# Patient Record
Sex: Female | Born: 1945 | Race: White | Hispanic: No | Smoking: Never smoker
Health system: Southern US, Community
[De-identification: ages and names within clinical notes are randomized; demographics above are authoritative.]

## PROBLEM LIST (undated history)

## (undated) DIAGNOSIS — I214 Non-ST elevation (NSTEMI) myocardial infarction: Secondary | ICD-10-CM

## (undated) HISTORY — DX: Non-ST elevation (NSTEMI) myocardial infarction: I21.4

---

## 1996-12-17 HISTORY — PX: THYROIDECTOMY, PARTIAL: SHX18

## 2002-12-17 HISTORY — PX: BREAST ENHANCEMENT SURGERY: SHX7

## 2010-12-18 LAB — HM DEXA SCAN: HM Dexa Scan: NORMAL

## 2012-05-28 DIAGNOSIS — E039 Hypothyroidism, unspecified: Secondary | ICD-10-CM | POA: Diagnosis not present

## 2012-05-28 DIAGNOSIS — M62838 Other muscle spasm: Secondary | ICD-10-CM | POA: Diagnosis not present

## 2012-05-28 DIAGNOSIS — R030 Elevated blood-pressure reading, without diagnosis of hypertension: Secondary | ICD-10-CM | POA: Diagnosis not present

## 2012-06-09 DIAGNOSIS — H2510 Age-related nuclear cataract, unspecified eye: Secondary | ICD-10-CM | POA: Diagnosis not present

## 2012-06-09 DIAGNOSIS — D3130 Benign neoplasm of unspecified choroid: Secondary | ICD-10-CM | POA: Diagnosis not present

## 2012-11-04 DIAGNOSIS — Z23 Encounter for immunization: Secondary | ICD-10-CM | POA: Diagnosis not present

## 2013-07-21 DIAGNOSIS — Z0000 Encounter for general adult medical examination without abnormal findings: Secondary | ICD-10-CM | POA: Diagnosis not present

## 2013-07-21 DIAGNOSIS — N951 Menopausal and female climacteric states: Secondary | ICD-10-CM | POA: Diagnosis not present

## 2013-07-21 DIAGNOSIS — Z79899 Other long term (current) drug therapy: Secondary | ICD-10-CM | POA: Diagnosis not present

## 2013-07-21 DIAGNOSIS — E039 Hypothyroidism, unspecified: Secondary | ICD-10-CM | POA: Diagnosis not present

## 2013-07-21 DIAGNOSIS — I43 Cardiomyopathy in diseases classified elsewhere: Secondary | ICD-10-CM | POA: Diagnosis not present

## 2014-12-28 DIAGNOSIS — E033 Postinfectious hypothyroidism: Secondary | ICD-10-CM | POA: Diagnosis not present

## 2014-12-28 DIAGNOSIS — Z79899 Other long term (current) drug therapy: Secondary | ICD-10-CM | POA: Diagnosis not present

## 2014-12-28 DIAGNOSIS — E785 Hyperlipidemia, unspecified: Secondary | ICD-10-CM | POA: Diagnosis not present

## 2014-12-28 DIAGNOSIS — N951 Menopausal and female climacteric states: Secondary | ICD-10-CM | POA: Diagnosis not present

## 2014-12-28 DIAGNOSIS — Z09 Encounter for follow-up examination after completed treatment for conditions other than malignant neoplasm: Secondary | ICD-10-CM | POA: Diagnosis not present

## 2014-12-28 DIAGNOSIS — I10 Essential (primary) hypertension: Secondary | ICD-10-CM | POA: Diagnosis not present

## 2014-12-28 DIAGNOSIS — Z23 Encounter for immunization: Secondary | ICD-10-CM | POA: Diagnosis not present

## 2014-12-28 DIAGNOSIS — I872 Venous insufficiency (chronic) (peripheral): Secondary | ICD-10-CM | POA: Diagnosis not present

## 2014-12-28 LAB — TSH: TSH: 3.3 u[IU]/mL (ref <=5.90)

## 2014-12-28 LAB — BASIC METABOLIC PANEL
BUN: 17 mg/dL (ref 4–21)
CREATININE: 0.8 mg/dL (ref <=1.1)

## 2014-12-28 LAB — LIPID PANEL
Cholesterol: 219 mg/dL — AB (ref 0–200)
HDL: 72 mg/dL — AB (ref 35–70)
LDL Cholesterol: 107 mg/dL
Triglycerides: 200 mg/dL — AB (ref 40–160)

## 2014-12-28 LAB — CBC AND DIFFERENTIAL: Hemoglobin: 15 g/dL (ref 12.0–16.0)

## 2015-07-13 ENCOUNTER — Other Ambulatory Visit: Payer: Self-pay | Admitting: Internal Medicine

## 2015-07-15 ENCOUNTER — Encounter: Payer: Self-pay | Admitting: Internal Medicine

## 2015-07-15 DIAGNOSIS — Z8619 Personal history of other infectious and parasitic diseases: Secondary | ICD-10-CM | POA: Insufficient documentation

## 2015-07-15 DIAGNOSIS — E785 Hyperlipidemia, unspecified: Secondary | ICD-10-CM | POA: Insufficient documentation

## 2015-07-15 DIAGNOSIS — Z8673 Personal history of transient ischemic attack (TIA), and cerebral infarction without residual deficits: Secondary | ICD-10-CM | POA: Insufficient documentation

## 2015-07-15 DIAGNOSIS — N951 Menopausal and female climacteric states: Secondary | ICD-10-CM | POA: Insufficient documentation

## 2015-07-15 DIAGNOSIS — E033 Postinfectious hypothyroidism: Secondary | ICD-10-CM | POA: Insufficient documentation

## 2015-07-15 DIAGNOSIS — I872 Venous insufficiency (chronic) (peripheral): Secondary | ICD-10-CM | POA: Insufficient documentation

## 2015-09-23 ENCOUNTER — Other Ambulatory Visit: Payer: Self-pay | Admitting: Internal Medicine

## 2015-11-13 ENCOUNTER — Other Ambulatory Visit: Payer: Self-pay | Admitting: Internal Medicine

## 2016-01-25 ENCOUNTER — Encounter: Payer: Self-pay | Admitting: Internal Medicine

## 2016-02-22 ENCOUNTER — Other Ambulatory Visit: Payer: Self-pay | Admitting: Internal Medicine

## 2016-02-24 ENCOUNTER — Encounter: Payer: Self-pay | Admitting: *Deleted

## 2016-04-21 ENCOUNTER — Encounter: Payer: Self-pay | Admitting: Internal Medicine

## 2016-04-23 ENCOUNTER — Ambulatory Visit (INDEPENDENT_AMBULATORY_CARE_PROVIDER_SITE_OTHER): Payer: Medicare Other | Admitting: Internal Medicine

## 2016-04-23 ENCOUNTER — Encounter: Payer: Self-pay | Admitting: Internal Medicine

## 2016-04-23 VITALS — BP 150/86 | HR 86 | Temp 98.0°F | Resp 16 | Ht 70.0 in | Wt 177.2 lb

## 2016-04-23 DIAGNOSIS — E033 Postinfectious hypothyroidism: Secondary | ICD-10-CM

## 2016-04-23 DIAGNOSIS — E785 Hyperlipidemia, unspecified: Secondary | ICD-10-CM | POA: Diagnosis not present

## 2016-04-23 DIAGNOSIS — Z0000 Encounter for general adult medical examination without abnormal findings: Secondary | ICD-10-CM | POA: Diagnosis not present

## 2016-04-23 DIAGNOSIS — Z8673 Personal history of transient ischemic attack (TIA), and cerebral infarction without residual deficits: Secondary | ICD-10-CM | POA: Diagnosis not present

## 2016-04-23 DIAGNOSIS — Z1239 Encounter for other screening for malignant neoplasm of breast: Secondary | ICD-10-CM

## 2016-04-23 DIAGNOSIS — Z23 Encounter for immunization: Secondary | ICD-10-CM | POA: Diagnosis not present

## 2016-04-23 LAB — POCT URINALYSIS DIPSTICK
Bilirubin, UA: NEGATIVE
Blood, UA: NEGATIVE
Glucose, UA: NEGATIVE
Ketones, UA: NEGATIVE
Leukocytes, UA: NEGATIVE
Nitrite, UA: NEGATIVE
Protein, UA: NEGATIVE
Spec Grav, UA: 1.01
Urobilinogen, UA: 0.2
pH, UA: 7

## 2016-04-23 MED ORDER — ALPRAZOLAM 0.5 MG PO TABS: ORAL_TABLET | ORAL | Status: DC

## 2016-04-23 MED ORDER — TRETINOIN 0.1 % EX CREA: TOPICAL_CREAM | Freq: Every day | CUTANEOUS | Status: DC

## 2016-04-23 MED ORDER — LEVOTHYROXINE SODIUM 150 MCG PO TABS: 150.0000 ug | ORAL_TABLET | Freq: Every day | ORAL | Status: DC

## 2016-04-23 NOTE — Patient Instructions (Addendum)
Pneumococcal Polysaccharide Vaccine: What You Need to Know 1. Why get vaccinated? Vaccination can protect older adults (and some children and younger adults) from pneumococcal disease. Pneumococcal disease is caused by bacteria that can spread from person to person through close contact. It can cause ear infections, and it can also lead to more serious infections of the:   Lungs (pneumonia),  Blood (bacteremia), and  Covering of the brain and spinal cord (meningitis). Meningitis can cause deafness and brain damage, and it can be fatal. Anyone can get pneumococcal disease, but children under 62 years of age, people with certain medical conditions, adults over 68 years of age, and cigarette smokers are at the highest risk. About 18,000 older adults die each year from pneumococcal disease in the [place]. Treatment of pneumococcal infections with penicillin and other drugs used to be more effective. But some strains of the disease have become resistant to these drugs. This makes prevention of the disease, through vaccination, even more important. 2. Pneumococcal polysaccharide vaccine (PPSV23) Pneumococcal polysaccharide vaccine (PPSV23) protects against 23 types of pneumococcal bacteria. It will not prevent all pneumococcal disease. PPSV23 is recommended for:  All adults 6 years of age and older,  Anyone 2 through 71 years of age with certain long-term health problems,  Anyone 2 through 71 years of age with a weakened immune system,  Adults 64 through 71 years of age who smoke cigarettes or have asthma. Most people need only one dose of PPSV. A second dose is recommended for certain high-risk groups. People 53 and older should get a dose even if they have gotten one or more doses of the vaccine before they turned 65. Your healthcare provider can give you more information about these recommendations. Most healthy adults develop protection within 2 to 3 weeks of getting the shot. 3. Some  people should not get this vaccine  Anyone who has had a life-threatening allergic reaction to PPSV should not get another dose.  Anyone who has a severe allergy to any component of PPSV should not receive it. Tell your provider if you have any severe allergies.  Anyone who is moderately or severely ill when the shot is scheduled may be asked to wait until they recover before getting the vaccine. Someone with a mild illness can usually be vaccinated.  Children less than 83 years of age should not receive this vaccine.  There is no evidence that PPSV is harmful to either a pregnant woman or to her fetus. However, as a precaution, women who need the vaccine should be vaccinated before becoming pregnant, if possible. 4. Risks of a vaccine reaction With any medicine, including vaccines, there is a chance of side effects. These are usually mild and go away on their own, but serious reactions are also possible. About half of people who get PPSV have mild side effects, such as redness or pain where the shot is given, which go away within about two days. Less than 1 out of 100 people develop a fever, muscle aches, or more severe local reactions. Problems that could happen after any vaccine:  People sometimes faint after a medical procedure, including vaccination. Sitting or lying down for about 15 minutes can help prevent fainting, and injuries caused by a fall. Tell your doctor if you feel dizzy, or have vision changes or ringing in the ears.  Some people get severe pain in the shoulder and have difficulty moving the arm where a shot was given. This happens very rarely.  Any medication  can cause a severe allergic reaction. Such reactions from a vaccine are very rare, estimated at about 1 in a million doses, and would happen within a few minutes to a few hours after the vaccination. As with any medicine, there is a very remote chance of a vaccine causing a serious injury or death. The safety of  vaccines is always being monitored. For more information, visit: http://www.aguilar.org/ 5. What if there is a serious reaction? What should I look for? Look for anything that concerns you, such as signs of a severe allergic reaction, very high fever, or unusual behavior.  Signs of a severe allergic reaction can include hives, swelling of the face and throat, difficulty breathing, a fast heartbeat, dizziness, and weakness. These would usually start a few minutes to a few hours after the vaccination. What should I do? If you think it is a severe allergic reaction or other emergency that can't wait, call 9-1-1 or get to the nearest hospital. Otherwise, call your doctor. Afterward, the reaction should be reported to the Vaccine Adverse Event Reporting System (VAERS). Your doctor might file this report, or you can do it yourself through the VAERS web site at www.vaers.SamedayNews.es, or by calling 551-610-1634.  VAERS does not give medical advice. 6. How can I learn more?  Ask your doctor. He or she can give you the vaccine package insert or suggest other sources of information.  Call your local or state health department.  Contact the Centers for Disease Control and Prevention (CDC):  Call (814)401-1432 (1-800-CDC-INFO) or  Visit CDC's website at http://hunter.com/ CDC Pneumococcal Polysaccharide Vaccine VIS (04/09/14)   This information is not intended to replace advice given to you by your health care provider. Make sure you discuss any questions you have with your health care provider.   Document Released: 09/30/2006 Document Revised: 12/24/2014 Document Reviewed: 04/12/2014 Elsevier Interactive Patient Education 2016 [place] Maintenance  Topic Date Due  . Hepatitis C Screening  [birth year]  . TETANUS/TDAP  04/24/1964  . ZOSTAVAX  04/24/2005  . PNA vac Low Risk Adult (2 of 2 - PPSV23) 12/29/2015  . MAMMOGRAM  12/28/2024 (Originally 04/25/1995)  . COLONOSCOPY   12/28/2024 (Originally 04/25/1995)  . INFLUENZA VACCINE  07/17/2016  . DEXA SCAN  Completed

## 2016-04-23 NOTE — Progress Notes (Signed)
Patient: Debbie Hubbard, Female    DOB: 25-Mar-1946, 70 y.o.   MRN: [id number] Visit Date: 04/23/2016  Today's Provider: Halina Maidens, MD   Chief Complaint  Patient presents with  . Medicare Wellness  . Hypothyroidism   Subjective:    Annual wellness visit Debbie Hubbard is a 70 y.o. female who presents today for her Subsequent Annual Wellness Visit. She feels well. She reports exercising daily. She reports she is sleeping well with xanax.  She continues to take Premphase and has spotting several times per year.  She feels well and does not want to stop medication.  She agrees today to have a mammogram.  ----------------------------------------------------------- Thyroid Problem Presents for follow-up visit. Symptoms include anxiety. Patient reports no cold intolerance, constipation, depressed mood, fatigue, hoarse voice, leg swelling, palpitations, tremors, visual change or weight gain. The symptoms have been stable. Past treatments include levothyroxine. The treatment provided significant relief.  Anxiety Symptoms include insomnia and nervous/anxious behavior. Patient reports no chest pain, decreased concentration, depressed mood, dizziness, nausea, palpitations, shortness of breath or suicidal ideas. Symptoms occur most days. The severity of symptoms is moderate. The symptoms are aggravated by social activities. The quality of sleep is good.   Past treatments include benzodiazephines. The treatment provided significant relief. Compliance with prior treatments has been good.    Review of Systems  Constitutional: Negative for fever, chills, weight gain, fatigue and unexpected weight change.  HENT: Negative for hearing loss, hoarse voice and sinus pressure.   Eyes: Negative for visual disturbance.  Respiratory: Negative for chest tightness, shortness of breath and wheezing.   Cardiovascular: Negative for chest pain, palpitations and leg swelling.  Gastrointestinal: Negative  for nausea, vomiting, abdominal pain, constipation and blood in stool.  Endocrine: Negative for cold intolerance, polydipsia and polyuria.  Genitourinary: Positive for vaginal bleeding (occasional spotting). Negative for dysuria and difficulty urinating.  Musculoskeletal: Negative for back pain and arthralgias.  Allergic/Immunologic: Negative for environmental allergies.  Neurological: Negative for dizziness, tremors, syncope, weakness and headaches.  Hematological: Negative for adenopathy.  Psychiatric/Behavioral: Positive for sleep disturbance. Negative for suicidal ideas, dysphoric mood and decreased concentration. The patient is nervous/anxious and has insomnia.     Social History   Social History  . Marital Status: Unknown    Spouse Name: N/A  . Number of Children: N/A  . Years of Education: N/A   Occupational History  . Not on file.   Social History Main Topics  . Smoking status: Never Smoker   . Smokeless tobacco: Never Used  . Alcohol Use: No  . Drug Use: No  . Sexual Activity: Not on file   Other Topics Concern  . Not on file   Social History Narrative    Patient Active Problem List   Diagnosis Date Noted  . Hx-TIA (transient ischemic attack) 07/15/2015  . 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  . Hyperlipidemia, mild 07/15/2015  . Hot flash, menopausal 07/15/2015  . 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  . Venous insufficiency of leg 07/15/2015    Past Surgical History  Procedure Laterality Date  . Breast enhancement surgery  2004  . Thyroidectomy, partial  1998    [facility]    Her Family history is unknown by patient.    Previous Medications   ALPRAZOLAM (XANAX) 0.5 MG TABLET    TAKE 1 TAB AT BEDTIME   PREMARIN VAGINAL CREAM    APPLY VAGINALLY DAILY AS DIRECTED   PREMPHASE 0.625-5 MG TABS TABLET    TAKE 1 TABLET BY MOUTH DAILY  SYNTHROID 150 MCG TABLET    TAKE 1 TABLET EVERY DAY   TRETINOIN (RETIN-A) 0.1 % CREAM    1 APPLICATION, EXTERNAL, DAILY     No care team member to display     Objective:   Vitals: BP 180/105 mmHg  Pulse 86  Temp(Src) 98 F (36.7 C) (Oral)  Resp 16  Ht 5\' 10"  (1.778 m)  Wt 177 lb 3.2 oz (80.377 kg)  BMI 25.43 kg/m2  SpO2 100%  LMP  (LMP Unknown)  Physical Exam  Constitutional: She is oriented to person, place, and time. She appears well-developed and well-nourished. No distress.  HENT:  Head: Normocephalic and atraumatic.  Right Ear: Tympanic membrane and ear canal normal.  Left Ear: Tympanic membrane and ear canal normal.  Nose: Right sinus exhibits no maxillary sinus tenderness. Left sinus exhibits no maxillary sinus tenderness.  Mouth/Throat: Uvula is midline and oropharynx is clear and moist.  Eyes: Conjunctivae and EOM are normal. Right eye exhibits no discharge. Left eye exhibits no discharge. No scleral icterus.  Neck: Normal range of motion. Carotid bruit is not present. No erythema present. No thyromegaly present.  Cardiovascular: Normal rate, regular rhythm, normal heart sounds and normal pulses.   Pulmonary/Chest: Effort normal. No respiratory distress. She has no wheezes. Right breast exhibits no mass, no nipple discharge, no skin change and no tenderness. Left breast exhibits no mass, no nipple discharge, no skin change and no tenderness.    Abdominal: Soft. Bowel sounds are normal. There is no hepatosplenomegaly. There is no tenderness. There is no CVA tenderness.  Musculoskeletal: Normal range of motion.  Lymphadenopathy:    She has no cervical adenopathy.    She has no axillary adenopathy.  Neurological: She is alert and oriented to person, place, and time. She has normal reflexes. No cranial nerve deficit or sensory deficit.  Skin: Skin is warm, dry and intact. No rash noted.  Scattered AK and SK over back,chest and abdomen  Psychiatric: She has a normal mood and affect. Her speech is normal and behavior is normal. Thought content normal.  Nursing note and vitals  reviewed.   Activities of Daily Living In your present state of health, do you have any difficulty performing the following activities: 04/23/2016  Hearing? N  Vision? N  Difficulty concentrating or making decisions? N  Walking or climbing stairs? N  Dressing or bathing? N  Doing errands, shopping? N    Fall Risk Assessment Fall Risk  04/23/2016  Falls in the past year? No    Depression Screen PHQ 2/9 Scores 04/23/2016  PHQ - 2 Score 0    Cognitive Testing - 6-CIT   Correct? Score   What year is it? yes 0 Yes = 0    No = 4  What month is it? yes 0 Yes = 0    No = 3  Remember:     [address]ia [address], [place], [place]     What time is it? yes 0 Yes = 0    No = 3  Count backwards from 20 to 1 yes 0 Correct = 0    1 error = 2   More than 1 error = 4  Say the months of the year in reverse. yes 0 Correct = 0    1 error = 2   More than 1 error = 4  What address did I ask you to remember? yes 0 Correct = 0  1 error = 2  2 error = 4    3 error = 6    4 error = 8    All wrong = 10       TOTAL SCORE  0/28   Interpretation:  Normal  Normal (0-7) Abnormal (8-28)        Medicare Annual Wellness Visit Summary:  Reviewed patient's Family Medical History Reviewed and updated list of patient's medical providers Assessment of cognitive impairment was done Assessed patient's functional ability Established a written schedule for health screening [place] Completed and Reviewed  Exercise Activities and Dietary recommendations Goals    None      Immunization History  Administered Date(s) Administered  . Pneumococcal Conjugate-13 12/28/2014    Health Maintenance  Topic Date Due  . Hepatitis C Screening  [birth year]  . TETANUS/TDAP  04/24/1964  . ZOSTAVAX  04/24/2005  . PNA vac Low Risk Adult (2 of 2 - PPSV23) 12/29/2015  . MAMMOGRAM  12/28/2024 (Originally 04/25/1995)  . COLONOSCOPY  12/28/2024 (Originally 04/25/1995)  . INFLUENZA VACCINE  07/17/2016  .  DEXA SCAN  Completed     Discussed health benefits of physical activity, and encouraged her to engage in regular exercise appropriate for her age and condition.    ------------------------------------------------------------------------------------------------------------   Assessment & Plan:  1. Medicare annual wellness visit, subsequent completed - POCT urinalysis dipstick - Comprehensive metabolic panel  2. Post-infectious hypothyroidism Supplemented - will adjust dose if needed - levothyroxine (SYNTHROID) 150 MCG tablet; Take 1 tablet (150 mcg total) by mouth daily.  Dispense: 90 tablet; Refill: 3 - TSH  3. Hyperlipidemia, mild Continue diet, exercise and weight control - Lipid panel  4. Hx-TIA (transient ischemic attack) No recurrence of symptoms  5. Breast cancer screening Encouraged to schedule mammogram at [facility] - sheet given  6. Need for pneumococcal vaccination - Pneumococcal polysaccharide vaccine 23-valent greater than or equal to 2yo subcutaneous/IM   Halina Maidens, MD [facility]  04/23/2016

## 2016-04-24 LAB — LIPID PANEL
CHOL/HDL RATIO: 2.3 ratio (ref 0.0–4.4)
Cholesterol, Total: 199 mg/dL (ref 100–199)
HDL: 85 mg/dL (ref >39)
LDL Calculated: 83 mg/dL (ref 0–99)
TRIGLYCERIDES: 157 mg/dL — AB (ref 0–149)
VLDL Cholesterol Cal: 31 mg/dL (ref 5–40)

## 2016-04-24 LAB — COMPREHENSIVE METABOLIC PANEL
A/G RATIO: 1.4 (ref 1.2–2.2)
ALT: 18 IU/L (ref 0–32)
AST: 21 IU/L (ref 0–40)
Albumin: 4.4 g/dL (ref 3.5–4.8)
Alkaline Phosphatase: 48 IU/L (ref 39–117)
BUN/Creatinine Ratio: 24 (ref 12–28)
BUN: 18 mg/dL (ref 8–27)
Bilirubin Total: 0.4 mg/dL (ref 0.0–1.2)
CALCIUM: 9.3 mg/dL (ref 8.7–10.3)
CO2: 22 mmol/L (ref 18–29)
CREATININE: 0.76 mg/dL (ref 0.57–1.00)
Chloride: 104 mmol/L (ref 96–106)
GFR, EST AFRICAN AMERICAN: 92 mL/min/{1.73_m2} (ref >59)
GFR, EST NON AFRICAN AMERICAN: 80 mL/min/{1.73_m2} (ref >59)
GLUCOSE: 83 mg/dL (ref 65–99)
Globulin, Total: 3.1 g/dL (ref 1.5–4.5)
Potassium: 4.4 mmol/L (ref 3.5–5.2)
Sodium: 144 mmol/L (ref 134–144)
TOTAL PROTEIN: 7.5 g/dL (ref 6.0–8.5)

## 2016-04-24 LAB — TSH: TSH: 2.04 u[IU]/mL (ref 0.450–4.500)

## 2016-04-25 ENCOUNTER — Telehealth: Payer: Self-pay

## 2016-04-25 NOTE — Telephone Encounter (Signed)
Patient left message about reaction. Headache, Chills, and Arm Pain. Advised Tylenol and cold compress and went over severe allergic reactions and when to go to ER or call us back.

## 2016-04-26 ENCOUNTER — Other Ambulatory Visit: Payer: Self-pay | Admitting: Internal Medicine

## 2016-07-25 ENCOUNTER — Other Ambulatory Visit: Payer: Self-pay | Admitting: Internal Medicine

## 2016-10-14 ENCOUNTER — Other Ambulatory Visit: Payer: Self-pay | Admitting: Internal Medicine

## 2016-10-25 ENCOUNTER — Other Ambulatory Visit: Payer: Self-pay | Admitting: Internal Medicine

## 2016-10-25 ENCOUNTER — Telehealth: Payer: Self-pay | Admitting: Internal Medicine

## 2016-10-25 MED ORDER — ALPRAZOLAM 0.5 MG PO TABS: ORAL_TABLET | ORAL | 5 refills | Status: DC

## 2016-10-25 NOTE — Telephone Encounter (Signed)
At last appointment patient was instructed that she needed to get a mammogram to screen for breast cancer since she insists on staying on Premphase. She is now requesting a refill on HRT and has not had a mammogram.  HRT Rx is denied.

## 2016-10-29 NOTE — Telephone Encounter (Signed)
Have attempted a few times to go over this never get answer.

## 2016-11-10 ENCOUNTER — Other Ambulatory Visit: Payer: Self-pay | Admitting: Internal Medicine

## 2016-12-07 ENCOUNTER — Other Ambulatory Visit: Payer: Self-pay | Admitting: Internal Medicine

## 2016-12-15 ENCOUNTER — Other Ambulatory Visit: Payer: Self-pay | Admitting: Internal Medicine

## 2016-12-25 DIAGNOSIS — Z1231 Encounter for screening mammogram for malignant neoplasm of breast: Secondary | ICD-10-CM | POA: Diagnosis not present

## 2016-12-26 ENCOUNTER — Other Ambulatory Visit: Payer: Self-pay | Admitting: Internal Medicine

## 2016-12-26 MED ORDER — CONJ ESTROG-MEDROXYPROGEST ACE PO TABS: 1.0000 | ORAL_TABLET | Freq: Every day | ORAL | 12 refills | Status: DC

## 2016-12-26 NOTE — Progress Notes (Signed)
Let her know that mammogram is normal.  I will send new Rx for hormone replacement therapy.

## 2017-01-10 ENCOUNTER — Other Ambulatory Visit: Payer: Self-pay | Admitting: Internal Medicine

## 2017-01-10 ENCOUNTER — Ambulatory Visit: Payer: Medicare Other | Admitting: Internal Medicine

## 2017-01-10 DIAGNOSIS — E033 Postinfectious hypothyroidism: Secondary | ICD-10-CM | POA: Diagnosis not present

## 2017-01-11 LAB — TSH: TSH: 4.3 u[IU]/mL (ref 0.450–4.500)

## 2017-03-01 ENCOUNTER — Encounter: Payer: Self-pay | Admitting: Internal Medicine

## 2017-03-01 ENCOUNTER — Ambulatory Visit
Admission: RE | Admit: 2017-03-01 | Discharge: 2017-03-01 | Disposition: A | Discharge status: Home / Self Care | Payer: Medicare Other | Source: Ambulatory Visit | Attending: Internal Medicine | Admitting: Internal Medicine

## 2017-03-01 ENCOUNTER — Other Ambulatory Visit
Admission: RE | Admit: 2017-03-01 | Discharge: 2017-03-01 | Disposition: A | Discharge status: Home / Self Care | Payer: Medicare Other | Source: Ambulatory Visit | Attending: Internal Medicine | Admitting: Internal Medicine

## 2017-03-01 ENCOUNTER — Ambulatory Visit (INDEPENDENT_AMBULATORY_CARE_PROVIDER_SITE_OTHER): Payer: Medicare Other | Admitting: Internal Medicine

## 2017-03-01 ENCOUNTER — Ambulatory Visit: Payer: Self-pay

## 2017-03-01 VITALS — BP 142/89 | HR 102 | Temp 99.1°F | Resp 16 | Ht 70.0 in | Wt 174.0 lb

## 2017-03-01 DIAGNOSIS — R05 Cough: Secondary | ICD-10-CM

## 2017-03-01 DIAGNOSIS — R059 Cough, unspecified: Secondary | ICD-10-CM

## 2017-03-01 DIAGNOSIS — J40 Bronchitis, not specified as acute or chronic: Secondary | ICD-10-CM

## 2017-03-01 LAB — CBC WITH DIFFERENTIAL/PLATELET
Basophils Absolute: 0.1 10*3/uL (ref 0–0.1)
Basophils Relative: 1 %
EOS ABS: 0.2 10*3/uL (ref 0–0.7)
Eosinophils Relative: 2 %
HEMATOCRIT: 44.2 % (ref 35.0–47.0)
Hemoglobin: 15.1 g/dL (ref 12.0–16.0)
LYMPHS ABS: 1.6 10*3/uL (ref 1.0–3.6)
LYMPHS PCT: 15 %
MCH: 32.5 pg (ref 26.0–34.0)
MCHC: 34.2 g/dL (ref 32.0–36.0)
MCV: 95 fL (ref 80.0–100.0)
MONOS PCT: 5 %
Monocytes Absolute: 0.6 10*3/uL (ref 0.2–0.9)
NEUTROS PCT: 77 %
Neutro Abs: 8 10*3/uL — ABNORMAL HIGH (ref 1.4–6.5)
Platelets: 278 10*3/uL (ref 150–440)
RBC: 4.66 MIL/uL (ref 3.80–5.20)
RDW: 12.8 % (ref 11.5–14.5)
WBC: 10.4 10*3/uL (ref 3.6–11.0)

## 2017-03-01 LAB — POCT INFLUENZA A/B
Influenza A, POC: NEGATIVE
Influenza B, POC: NEGATIVE

## 2017-03-01 MED ORDER — ALBUTEROL SULFATE HFA 108 (90 BASE) MCG/ACT IN AERS: 2.0000 | INHALATION_SPRAY | Freq: Four times a day (QID) | RESPIRATORY_TRACT | 0 refills | Status: DC | PRN

## 2017-03-01 MED ORDER — GUAIFENESIN-CODEINE 100-10 MG/5ML PO SYRP: 5.0000 mL | ORAL_SOLUTION | Freq: Three times a day (TID) | ORAL | 0 refills | Status: DC | PRN

## 2017-03-01 MED ORDER — LEVOFLOXACIN 500 MG PO TABS: 500.0000 mg | ORAL_TABLET | Freq: Every day | ORAL | 0 refills | Status: DC

## 2017-03-01 NOTE — Progress Notes (Signed)
Date:  03/01/2017   Name:  Debbie Hubbard   DOB:  1946-03-27   MRN:  [id number]   Chief Complaint: Fatigue (cough and fatigue. SOB almost passed out Monday. Worsening weakness in legs and SOB. ) Fever   This is a new problem. The current episode started in the past 7 days. The problem occurs intermittently. The maximum temperature noted was 99 to 99.9 F. Associated symptoms include congestion, coughing, muscle aches and wheezing. Pertinent negatives include no abdominal pain, diarrhea, headaches, nausea, sore throat or vomiting. She has tried NSAIDs for the symptoms. The treatment provided mild relief.  She is able to eat and drink normally.  Some intestinal gas but no diarrhea. She has no hx of COPD or asthma.  She has never used albuterol inhaler.    Review of Systems  Constitutional: Positive for fever.  HENT: Positive for congestion. Negative for sore throat and trouble swallowing.   Eyes: Negative for visual disturbance.  Respiratory: Positive for cough and wheezing.   Gastrointestinal: Negative for abdominal pain, diarrhea, nausea and vomiting.  Genitourinary: Negative for frequency and hematuria.  Musculoskeletal: Positive for myalgias. Negative for arthralgias.  Neurological: Positive for weakness and light-headedness. Negative for dizziness, syncope, numbness and headaches.    Patient Active Problem List   Diagnosis Date Noted  . Hx-TIA (transient ischemic attack) 07/15/2015  . 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  . Hyperlipidemia, mild 07/15/2015  . Hot flash, menopausal 07/15/2015  . 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  . Venous insufficiency of leg 07/15/2015    Prior to Admission medications   Medication Sig Start Date End Date Taking? Authorizing Provider  ALPRAZolam Duanne Moron) 0.5 MG tablet TAKE 1 TAB AT BEDTIME 10/25/16  Yes Glean Hess, MD  conjugated estrogens-medroxyprogesteron (PREMPHASE) TABS tablet Take 1 tablet by mouth daily. 12/26/16  Yes  Glean Hess, MD  levothyroxine (SYNTHROID) 150 MCG tablet Take 1 tablet (150 mcg total) by mouth daily. 04/23/16  Yes Glean Hess, MD  PREMARIN vaginal cream APPLY VAGINALLY DAILY AS DIRECTED 07/25/16  Yes Glean Hess, MD  SYNTHROID 150 MCG tablet TAKE 1 TABLET EVERY DAY 04/26/16  Yes Glean Hess, MD  tretinoin (RETIN-A) 0.1 % cream Apply topically at bedtime. 04/23/16  Yes Glean Hess, MD    Allergies  Allergen Reactions  . Bee Venom Swelling and Hives    Past Surgical History:  Procedure Laterality Date  . BREAST ENHANCEMENT SURGERY  2004  . THYROIDECTOMY, PARTIAL  1998   [facility]    Social History  Substance Use Topics  . Smoking status: Never Smoker  . Smokeless tobacco: Never Used  . Alcohol use No     Medication list has been reviewed and updated.   Physical Exam  Constitutional: She is oriented to person, place, and time. She appears well-developed. She has a sickly appearance. No distress.  HENT:  Head: Normocephalic and atraumatic.  Neck: Normal range of motion. Neck supple.  Cardiovascular: Regular rhythm.  Tachycardia present.   Pulmonary/Chest: Effort normal. No respiratory distress. She has decreased breath sounds. She has wheezes in the right upper field, the right lower field, the left upper field and the left lower field.  Musculoskeletal: Normal range of motion.  Neurological: She is alert and oriented to person, place, and time.  Skin: Skin is warm and dry. No rash noted.  Psychiatric: She has a normal mood and affect. Her speech is normal and behavior is normal. Thought content normal.  Nursing note and  vitals reviewed.   BP (!) 142/89   Pulse (!) 102   Temp 99.1 F (37.3 C) (Oral)   Resp 16   Ht 5\' 10"  (1.778 m)   Wt 174 lb (78.9 kg)   LMP  (LMP Unknown)   SpO2 98%   BMI 24.97 kg/m   Assessment and Plan: 1. Cough Influenza negative Suspect PNA or Bronchitis - POCT Influenza A/B - DG Chest 2 View; Future - albuterol  (PROVENTIL HFA;VENTOLIN HFA) 108 (90 Base) MCG/ACT inhaler; Inhale 2 puffs into the lungs every 6 (six) hours as needed for wheezing or shortness of breath.  Dispense: 1 Inhaler; Refill: 0 - CBC with Differential/Platelet  2. Bronchitis - levofloxacin (LEVAQUIN) 500 MG tablet; Take 1 tablet (500 mg total) by mouth daily.  Dispense: 10 tablet; Refill: 0 - guaiFENesin-codeine (ROBITUSSIN AC) 100-10 MG/5ML syrup; Take 5 mLs by mouth 3 (three) times daily as needed for cough.  Dispense: 150 mL; Refill: 0 - CBC with Differential/Platelet   Meds ordered this encounter  Medications  . levofloxacin (LEVAQUIN) 500 MG tablet    Sig: Take 1 tablet (500 mg total) by mouth daily.    Dispense:  10 tablet    Refill:  0  . albuterol (PROVENTIL HFA;VENTOLIN HFA) 108 (90 Base) MCG/ACT inhaler    Sig: Inhale 2 puffs into the lungs every 6 (six) hours as needed for wheezing or shortness of breath.    Dispense:  1 Inhaler    Refill:  0  . guaiFENesin-codeine (ROBITUSSIN AC) 100-10 MG/5ML syrup    Sig: Take 5 mLs by mouth 3 (three) times daily as needed for cough.    Dispense:  150 mL    Refill:  0    Halina Maidens, MD [facility]  03/01/2017

## 2017-03-01 NOTE — Patient Instructions (Signed)
Delsym cough syrup - good over the counter

## 2017-03-11 ENCOUNTER — Other Ambulatory Visit: Payer: Self-pay | Admitting: Internal Medicine

## 2017-04-03 IMAGING — CR DG CHEST 2V
3 series · 3 of 3 positions shown · non-contrast
Comparison: None.

CLINICAL DATA: Cough and fever for 3 weeks.

EXAM:
CHEST  2 VIEW

[chest pa]
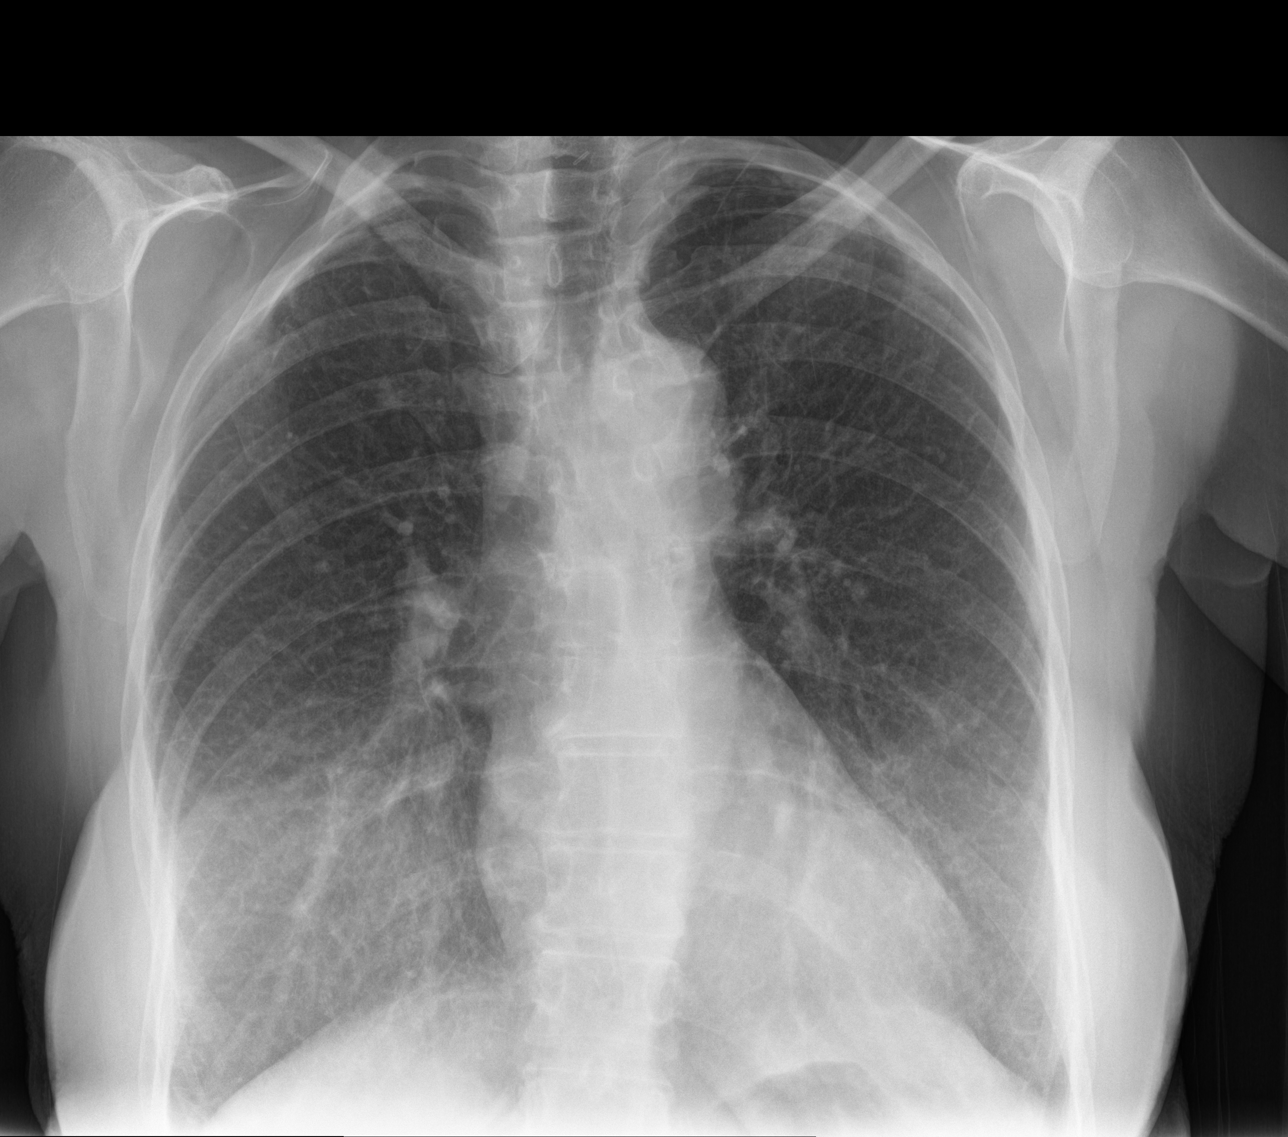

[chest lat]
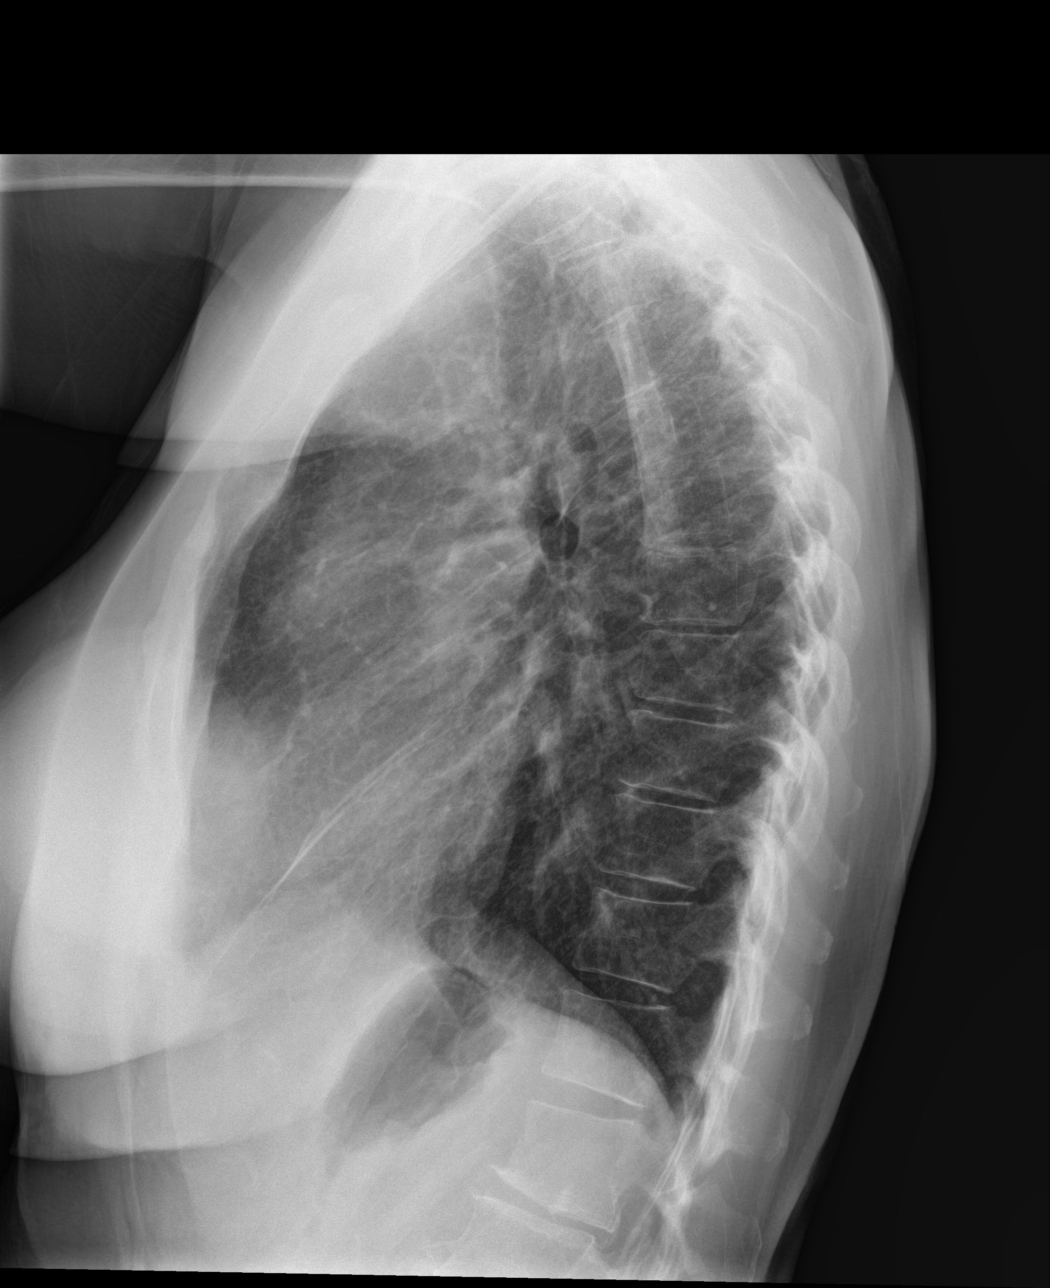

[chest ap]
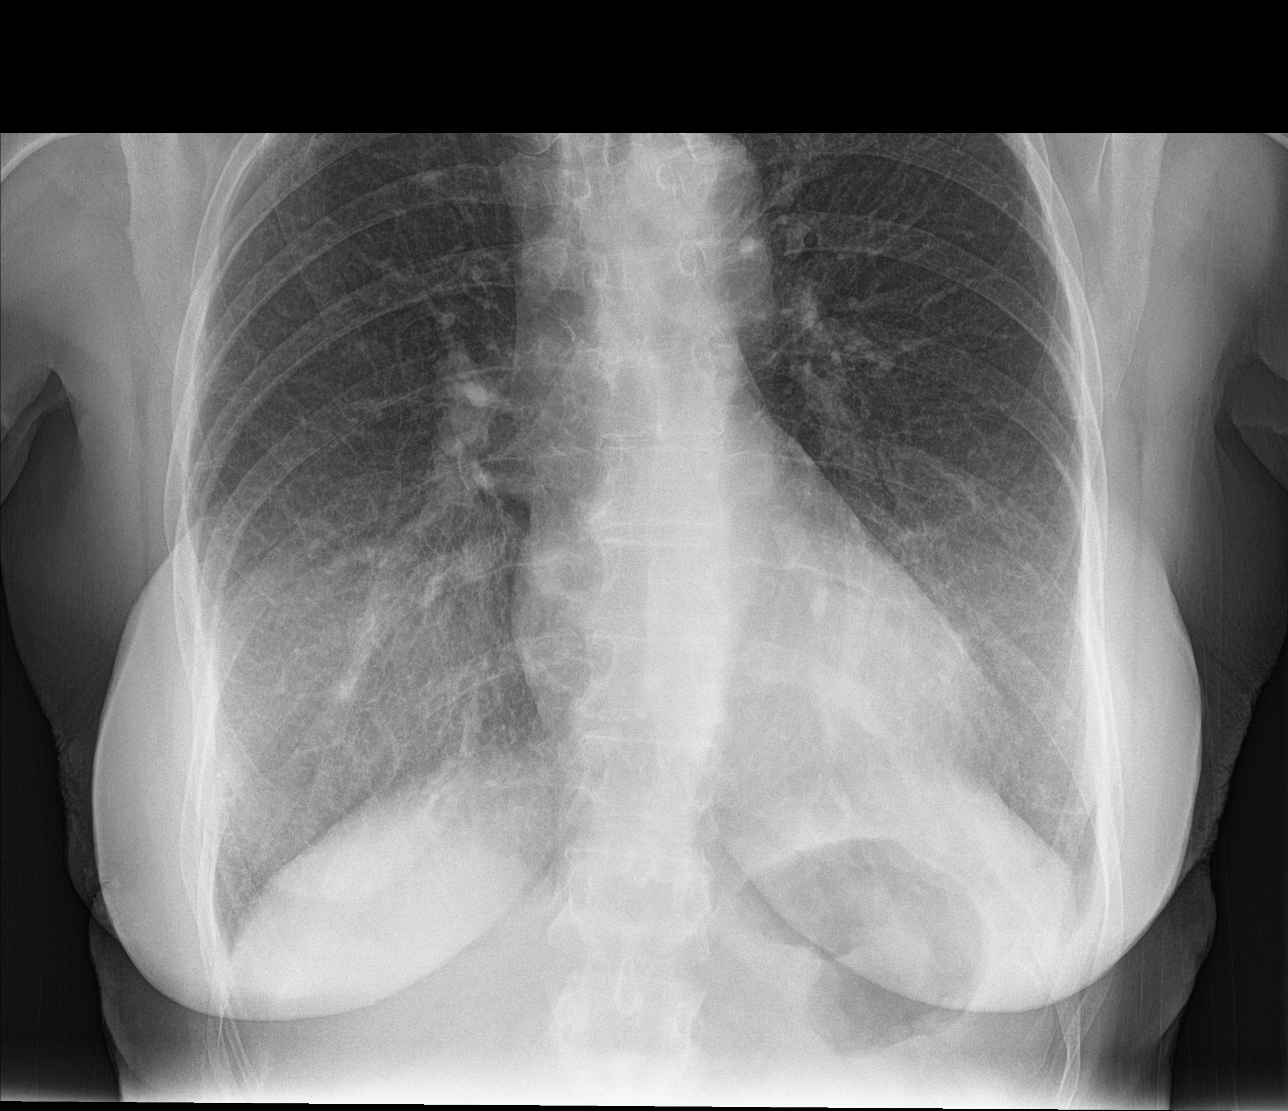

[3 of 3 positions shown; findings below may reference images not displayed]

FINDINGS: The heart size and mediastinal contours are within normal limits.
Both lungs are clear. The visualized skeletal structures are
unremarkable. Breast implants noted.
IMPRESSION: No active cardiopulmonary disease.

## 2017-04-10 ENCOUNTER — Ambulatory Visit (INDEPENDENT_AMBULATORY_CARE_PROVIDER_SITE_OTHER): Payer: Medicare Other | Admitting: Internal Medicine

## 2017-04-10 ENCOUNTER — Encounter: Payer: Self-pay | Admitting: Internal Medicine

## 2017-04-10 VITALS — BP 162/94 | HR 104 | Temp 98.0°F | Ht 70.0 in | Wt 168.3 lb

## 2017-04-10 DIAGNOSIS — R0981 Nasal congestion: Secondary | ICD-10-CM

## 2017-04-10 MED ORDER — ALPRAZOLAM 0.5 MG PO TABS: ORAL_TABLET | ORAL | 5 refills | Status: DC

## 2017-04-10 MED ORDER — FLUTICASONE PROPIONATE 50 MCG/ACT NA SUSP: 2.0000 | Freq: Every day | NASAL | 0 refills | Status: DC

## 2017-04-10 NOTE — Patient Instructions (Signed)
Mucinex (plain) take twice a day  Drink sufficient liquids  Xyzal 5 mg take once a day

## 2017-04-10 NOTE — Progress Notes (Signed)
Date:  04/10/2017   Name:  Debbie Hubbard   DOB:  08-04-1946   MRN:  [id number]   Chief Complaint: Sinusitis (Drainage in throat. Pressure in ears and face. Patient says she has white production. (Brought sample in cup). Patient feels she was having difficulty breathing last night while trying to lay down. ) Sinus Problem  This is a new problem. The current episode started in the past 7 days. The problem is unchanged. There has been no fever. Associated symptoms include congestion and sinus pressure. Pertinent negatives include no chills, coughing, headaches, shortness of breath or sore throat. (Thick nasal drainage, dry mouth at night) Past treatments include nothing.    Review of Systems  Constitutional: Positive for fatigue (from poor sleep). Negative for chills and fever.  HENT: Positive for congestion, postnasal drip and sinus pressure. Negative for sore throat, trouble swallowing and voice change.   Eyes: Negative for visual disturbance.  Respiratory: Negative for cough, chest tightness, shortness of breath and wheezing.   Cardiovascular: Negative for chest pain and palpitations.  Gastrointestinal: Negative for abdominal pain, diarrhea and vomiting.  Allergic/Immunologic: Negative for environmental allergies and food allergies.  Neurological: Negative for dizziness, light-headedness and headaches.  Psychiatric/Behavioral: Positive for sleep disturbance.    Patient Active Problem List   Diagnosis Date Noted  . Hx-TIA (transient ischemic attack) 07/15/2015  . 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  . Hyperlipidemia, mild 07/15/2015  . Hot flash, menopausal 07/15/2015  . 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  . Venous insufficiency of leg 07/15/2015    Prior to Admission medications   Medication Sig Start Date End Date Taking? Authorizing Provider  albuterol (PROVENTIL HFA;VENTOLIN HFA) 108 (90 Base) MCG/ACT inhaler Inhale 2 puffs into the lungs every 6 (six) hours as  needed for wheezing or shortness of breath. 03/01/17  Yes Glean Hess, MD  ALPRAZolam Duanne Moron) 0.5 MG tablet TAKE 1 TAB AT BEDTIME 10/25/16  Yes Glean Hess, MD  conjugated estrogens-medroxyprogesteron (PREMPHASE) TABS tablet Take 1 tablet by mouth daily. 12/26/16  Yes Glean Hess, MD  levothyroxine (SYNTHROID) 150 MCG tablet Take 1 tablet (150 mcg total) by mouth daily. 04/23/16  Yes Glean Hess, MD  PREMARIN vaginal cream APPLY VAGINALLY ONCE A DAY AS DIRECTED 03/12/17  Yes Glean Hess, MD  tretinoin (RETIN-A) 0.1 % cream Apply topically at bedtime. 04/23/16  Yes Glean Hess, MD    Allergies  Allergen Reactions  . Bee Venom Swelling and Hives    Past Surgical History:  Procedure Laterality Date  . BREAST ENHANCEMENT SURGERY  2004  . THYROIDECTOMY, PARTIAL  1998   [facility]    Social History  Substance Use Topics  . Smoking status: Never Smoker  . Smokeless tobacco: Never Used  . Alcohol use No     Medication list has been reviewed and updated.   Physical Exam  Constitutional: She is oriented to person, place, and time. She appears well-developed. No distress.  HENT:  Head: Normocephalic and atraumatic.  Right Ear: Ear canal normal. Tympanic membrane is retracted. Tympanic membrane is not erythematous.  Left Ear: Ear canal normal. Tympanic membrane is retracted. Tympanic membrane is not erythematous.  Nose: Right sinus exhibits no maxillary sinus tenderness and no frontal sinus tenderness. Left sinus exhibits no maxillary sinus tenderness and no frontal sinus tenderness.  Mouth/Throat: No posterior oropharyngeal erythema.  Thick white mucus in posterior pharynx  Neck: No JVD present. Carotid bruit is not present.  Cardiovascular: Normal rate, regular rhythm  and normal heart sounds.   Pulmonary/Chest: Effort normal. No respiratory distress. She has wheezes (few upper zone wheezes). She has no rhonchi. She has no rales.  Musculoskeletal: Normal range of  motion.  Neurological: She is alert and oriented to person, place, and time.  Skin: Skin is warm and dry. No rash noted.  Psychiatric: Her behavior is normal. Thought content normal. Her mood appears anxious.  Nursing note and vitals reviewed.   BP (!) 162/94 (BP Location: Right Arm, Patient Position: Sitting, Cuff Size: Normal) Comment: Pt seems worried.  Pulse (!) 104   Temp 98 F (36.7 C)   Ht 5\' 10"  (1.778 m)   Wt 168 lb 4.8 oz (76.3 kg)   LMP  (LMP Unknown)   SpO2 97%   BMI 24.15 kg/m   Assessment and Plan: 1. Congestion of nasal sinus Begin Mucinex bid, Xyzal 5mg  daily and Flonase NS daily Increase fluid intake Call in 5 days if not improved   Meds ordered this encounter  Medications  . fluticasone (FLONASE) 50 MCG/ACT nasal spray    Sig: Place 2 sprays into both nostrils daily.    Dispense:  16 g    Refill:  0  . ALPRAZolam (XANAX) 0.5 MG tablet    Sig: TAKE 1 TAB AT BEDTIME    Dispense:  30 tablet    Refill:  5    This request is for a new prescription for a controlled substance as required by Federal/State law.Halina Maidens, MD [facility]  04/10/2017

## 2017-04-12 ENCOUNTER — Telehealth: Payer: Self-pay

## 2017-04-12 NOTE — Telephone Encounter (Signed)
Pt informed

## 2017-04-12 NOTE — Telephone Encounter (Signed)
Pt calling stating medication told to take makes her feel extremely dizzy and out of breath. Want advice?

## 2017-04-12 NOTE — Telephone Encounter (Signed)
Stop taking Xyzal.  Continue mucinex and flonase as these are not likely to cause dizziness.

## 2017-04-25 ENCOUNTER — Other Ambulatory Visit: Payer: Self-pay | Admitting: Internal Medicine

## 2017-04-25 DIAGNOSIS — R918 Other nonspecific abnormal finding of lung field: Secondary | ICD-10-CM | POA: Diagnosis not present

## 2017-04-25 DIAGNOSIS — I5020 Unspecified systolic (congestive) heart failure: Secondary | ICD-10-CM | POA: Diagnosis not present

## 2017-04-25 DIAGNOSIS — R0603 Acute respiratory distress: Secondary | ICD-10-CM | POA: Diagnosis not present

## 2017-04-25 DIAGNOSIS — R4182 Altered mental status, unspecified: Secondary | ICD-10-CM | POA: Diagnosis not present

## 2017-04-25 DIAGNOSIS — J810 Acute pulmonary edema: Secondary | ICD-10-CM | POA: Diagnosis not present

## 2017-04-25 DIAGNOSIS — E033 Postinfectious hypothyroidism: Secondary | ICD-10-CM

## 2017-04-25 DIAGNOSIS — R570 Cardiogenic shock: Secondary | ICD-10-CM | POA: Diagnosis not present

## 2017-04-25 DIAGNOSIS — I160 Hypertensive urgency: Secondary | ICD-10-CM | POA: Diagnosis not present

## 2017-04-25 DIAGNOSIS — I428 Other cardiomyopathies: Secondary | ICD-10-CM | POA: Diagnosis not present

## 2017-04-25 DIAGNOSIS — D72829 Elevated white blood cell count, unspecified: Secondary | ICD-10-CM | POA: Diagnosis not present

## 2017-04-25 DIAGNOSIS — I5021 Acute systolic (congestive) heart failure: Secondary | ICD-10-CM | POA: Diagnosis not present

## 2017-04-25 DIAGNOSIS — I214 Non-ST elevation (NSTEMI) myocardial infarction: Secondary | ICD-10-CM | POA: Diagnosis not present

## 2017-04-25 DIAGNOSIS — Z79899 Other long term (current) drug therapy: Secondary | ICD-10-CM | POA: Diagnosis not present

## 2017-04-25 DIAGNOSIS — Z4682 Encounter for fitting and adjustment of non-vascular catheter: Secondary | ICD-10-CM | POA: Diagnosis not present

## 2017-04-25 DIAGNOSIS — I110 Hypertensive heart disease with heart failure: Secondary | ICD-10-CM | POA: Diagnosis present

## 2017-04-25 DIAGNOSIS — I429 Cardiomyopathy, unspecified: Secondary | ICD-10-CM | POA: Diagnosis not present

## 2017-04-25 DIAGNOSIS — J9601 Acute respiratory failure with hypoxia: Secondary | ICD-10-CM | POA: Diagnosis not present

## 2017-04-25 DIAGNOSIS — J189 Pneumonia, unspecified organism: Secondary | ICD-10-CM | POA: Diagnosis not present

## 2017-04-25 DIAGNOSIS — J9602 Acute respiratory failure with hypercapnia: Secondary | ICD-10-CM | POA: Diagnosis not present

## 2017-04-25 DIAGNOSIS — J9600 Acute respiratory failure, unspecified whether with hypoxia or hypercapnia: Secondary | ICD-10-CM | POA: Diagnosis not present

## 2017-04-25 DIAGNOSIS — Z452 Encounter for adjustment and management of vascular access device: Secondary | ICD-10-CM | POA: Diagnosis not present

## 2017-04-25 DIAGNOSIS — E890 Postprocedural hypothyroidism: Secondary | ICD-10-CM | POA: Diagnosis present

## 2017-04-25 DIAGNOSIS — R9431 Abnormal electrocardiogram [ECG] [EKG]: Secondary | ICD-10-CM | POA: Diagnosis not present

## 2017-04-25 DIAGNOSIS — I071 Rheumatic tricuspid insufficiency: Secondary | ICD-10-CM | POA: Diagnosis present

## 2017-04-25 DIAGNOSIS — J811 Chronic pulmonary edema: Secondary | ICD-10-CM | POA: Diagnosis not present

## 2017-04-25 DIAGNOSIS — I447 Left bundle-branch block, unspecified: Secondary | ICD-10-CM | POA: Diagnosis not present

## 2017-04-25 DIAGNOSIS — R0602 Shortness of breath: Secondary | ICD-10-CM | POA: Diagnosis not present

## 2017-04-25 DIAGNOSIS — I348 Other nonrheumatic mitral valve disorders: Secondary | ICD-10-CM | POA: Diagnosis not present

## 2017-04-25 DIAGNOSIS — J90 Pleural effusion, not elsewhere classified: Secondary | ICD-10-CM | POA: Diagnosis not present

## 2017-04-25 DIAGNOSIS — R109 Unspecified abdominal pain: Secondary | ICD-10-CM | POA: Diagnosis not present

## 2017-04-25 DIAGNOSIS — N179 Acute kidney failure, unspecified: Secondary | ICD-10-CM | POA: Diagnosis not present

## 2017-04-25 HISTORY — DX: Non-ST elevation (NSTEMI) myocardial infarction: I21.4

## 2017-04-25 HISTORY — PX: LEFT HEART CATH AND CORONARY ANGIOGRAPHY: CATH118249

## 2017-04-30 ENCOUNTER — Ambulatory Visit: Payer: Medicare Other | Admitting: Internal Medicine

## 2017-05-07 DIAGNOSIS — I509 Heart failure, unspecified: Secondary | ICD-10-CM | POA: Diagnosis not present

## 2017-05-07 DIAGNOSIS — I10 Essential (primary) hypertension: Secondary | ICD-10-CM | POA: Diagnosis not present

## 2017-05-07 DIAGNOSIS — I5022 Chronic systolic (congestive) heart failure: Secondary | ICD-10-CM | POA: Diagnosis not present

## 2017-05-07 DIAGNOSIS — I447 Left bundle-branch block, unspecified: Secondary | ICD-10-CM | POA: Diagnosis not present

## 2017-05-07 DIAGNOSIS — Z6823 Body mass index (BMI) 23.0-23.9, adult: Secondary | ICD-10-CM | POA: Diagnosis not present

## 2017-05-07 DIAGNOSIS — I5021 Acute systolic (congestive) heart failure: Secondary | ICD-10-CM | POA: Diagnosis not present

## 2017-05-07 DIAGNOSIS — I214 Non-ST elevation (NSTEMI) myocardial infarction: Secondary | ICD-10-CM | POA: Diagnosis not present

## 2017-05-14 ENCOUNTER — Telehealth: Payer: Self-pay | Admitting: Physician Assistant

## 2017-05-14 NOTE — Telephone Encounter (Signed)
Patient calling in saying she was recently hospitalized at [facility] on 04/25/2017 and misplaced her Xanax prescription and could she get a new one. Reviewed her discharge note. She was hospitalized with respiratory failure requiring mechanical ventilation with final dx of acute systolic heart failure 2/2 nonischemic cardiomyopathy. With the situation and her admission illness, do not feel comfortable filling this prescription and I will defer to Dr. Army Melia for management of this.

## 2017-05-14 NOTE — Telephone Encounter (Signed)
Called and left pt VM to inform.

## 2017-05-20 ENCOUNTER — Telehealth: Payer: Self-pay

## 2017-05-20 NOTE — Telephone Encounter (Signed)
Pt needs [facility] prescription [facility] [address] in [place]. Lost Rx when being transported to hospital.

## 2017-05-21 ENCOUNTER — Other Ambulatory Visit: Payer: Self-pay | Admitting: Internal Medicine

## 2017-05-21 MED ORDER — ALPRAZOLAM 0.5 MG PO TABS: ORAL_TABLET | ORAL | 5 refills | Status: DC

## 2017-05-27 ENCOUNTER — Telehealth: Payer: Self-pay | Admitting: *Deleted

## 2017-05-27 NOTE — Telephone Encounter (Signed)
Pt called wanting to come in just to get her Thyroid checked. Let pt know that we had not seen her for her Thyroid in over a year and needs appt. Transferred up front to make appointment with provider.

## 2017-05-28 ENCOUNTER — Other Ambulatory Visit: Payer: Self-pay | Admitting: Internal Medicine

## 2017-05-28 ENCOUNTER — Ambulatory Visit (INDEPENDENT_AMBULATORY_CARE_PROVIDER_SITE_OTHER): Payer: Medicare Other | Admitting: Internal Medicine

## 2017-05-28 ENCOUNTER — Encounter: Payer: Self-pay | Admitting: Internal Medicine

## 2017-05-28 ENCOUNTER — Ambulatory Visit: Payer: Medicare Other | Admitting: Internal Medicine

## 2017-05-28 VITALS — BP 122/74 | HR 74 | Ht 70.0 in | Wt 157.5 lb

## 2017-05-28 DIAGNOSIS — R790 Abnormal level of blood mineral: Secondary | ICD-10-CM | POA: Diagnosis not present

## 2017-05-28 DIAGNOSIS — I5022 Chronic systolic (congestive) heart failure: Secondary | ICD-10-CM | POA: Diagnosis not present

## 2017-05-28 DIAGNOSIS — E033 Postinfectious hypothyroidism: Secondary | ICD-10-CM | POA: Diagnosis not present

## 2017-05-28 DIAGNOSIS — I872 Venous insufficiency (chronic) (peripheral): Secondary | ICD-10-CM

## 2017-05-28 HISTORY — DX: Abnormal level of blood mineral: R79.0

## 2017-05-28 NOTE — Progress Notes (Signed)
Date:  05/28/2017   Name:  Debbie Hubbard   DOB:  03/15/1946   MRN:  [id number]   Chief Complaint: Hypothyroidism (No medication changes. Wants thyroid checked because she "feels off" - Digestive system is off- feeling exessively tired and cannot stand on feet. )  Thyroid Problem  Presents for follow-up visit. Symptoms include fatigue. Patient reports no constipation, diarrhea, leg swelling, palpitations, tremors or weight gain. Symptom course: elevated TSH in [facility] but normal T4.   CHF - had onset of acute CHF with normal CORS on cath.  EF 20%.  Started on medications, now had defibrillator vest.  Planning cardiology follow up in 2 weeks.  On Coreg and lisinopril - gets fatigued by mid day but no orthopnea or leg edema.  Has lost 20 lbs but now stabilized.  Appetite is decreased.  Review of Systems  Constitutional: Positive for fatigue. Negative for appetite change, chills, unexpected weight change and weight gain.  Respiratory: Positive for shortness of breath. Negative for cough, chest tightness and wheezing.   Cardiovascular: Negative for chest pain, palpitations and leg swelling.  Gastrointestinal: Negative for abdominal pain, constipation and diarrhea.  Musculoskeletal: Negative for arthralgias.  Neurological: Positive for weakness, light-headedness and numbness (in right leg). Negative for dizziness, tremors and headaches.  Psychiatric/Behavioral: Negative for sleep disturbance.    Patient Active Problem List   Diagnosis Date Noted  . Systolic heart failure ([place]) 04/25/2017  . LBBB (left bundle branch block) 04/25/2017  . NSTEMI (non-ST elevated myocardial infarction) ([place]) 04/25/2017  . Hx-TIA (transient ischemic attack) 07/15/2015  . 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  . Hyperlipidemia, mild 07/15/2015  . Hot flash, menopausal 07/15/2015  . 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  . Venous insufficiency of leg 07/15/2015    Prior to Admission medications     Medication Sig Start Date End Date Taking? Authorizing Provider  albuterol (PROVENTIL HFA;VENTOLIN HFA) 108 (90 Base) MCG/ACT inhaler Inhale 2 puffs into the lungs every 6 (six) hours as needed for wheezing or shortness of breath. 03/01/17  Yes Glean Hess, MD  ALPRAZolam Duanne Moron) 0.5 MG tablet TAKE 1 TAB AT BEDTIME 05/21/17  Yes Glean Hess, MD  carvedilol (COREG) 6.25 MG tablet Take 6.25 mg by mouth 2 (two) times daily. 05/07/17 06/06/17 Yes [provider]  conjugated estrogens-medroxyprogesteron (PREMPHASE) TABS tablet Take 1 tablet by mouth daily. 12/26/16  Yes Glean Hess, MD  lisinopril (PRINIVIL,ZESTRIL) 20 MG tablet Take 20 mg by mouth daily. 05/07/17 06/06/17 Yes [provider]  PREMARIN vaginal cream APPLY VAGINALLY ONCE A DAY AS DIRECTED 03/12/17  Yes Glean Hess, MD  SYNTHROID 150 MCG tablet TAKE 1 TABLET (150 MCG TOTAL) BY MOUTH DAILY. 04/25/17  Yes Glean Hess, MD  tretinoin (RETIN-A) 0.1 % cream Apply topically at bedtime. 04/23/16  Yes Glean Hess, MD    Allergies  Allergen Reactions  . Bee Venom Swelling and Hives    Past Surgical History:  Procedure Laterality Date  . BREAST ENHANCEMENT SURGERY  2004  . LEFT HEART CATH AND CORONARY ANGIOGRAPHY  04/25/2017  . THYROIDECTOMY, PARTIAL  1998   [facility]    Social History  Substance Use Topics  . Smoking status: Never Smoker  . Smokeless tobacco: Never Used  . Alcohol use No     Medication list has been reviewed and updated.   Physical Exam  Constitutional: She is oriented to person, place, and time. She appears well-developed. No distress.  HENT:  Head: Normocephalic and atraumatic.  Cardiovascular: Normal rate and normal heart sounds.   Pulmonary/Chest: Effort normal. No respiratory distress. She has no wheezes.  Musculoskeletal: Normal range of motion.  Neurological: She is alert and oriented to person, place, and time.  Skin: Skin is warm and dry. No rash noted.   Psychiatric: She has a normal mood and affect. Her speech is normal and behavior is normal. Thought content normal.  Nursing note and vitals reviewed.   BP 122/74 (BP Location: Right Arm, Patient Position: Sitting, Cuff Size: Normal)   Pulse 74   Ht 5\' 10"  (1.778 m)   Wt 157 lb 8 oz (71.4 kg)   LMP  (LMP Unknown)   SpO2 100%   BMI 22.60 kg/m   Assessment and Plan: 1. Post-infectious hypothyroidism Continue supplements, adjust dose if needed - Thyroid Panel With TSH  2. Chronic systolic heart failure (HCC) Continue lisinopril and coreg - space out dosing Patient encouraged to continue medications, exercise as instructed and follow up as planned with cardiolgoy - Basic metabolic panel  3. Venous insufficiency of lower extremity, unspecified laterality Some mild numbness in right leg since IABP was placed  4. Low blood magnesium Noted in [facility] but not supplemented - Magnesium   No orders of the defined types were placed in this encounter.   Halina Maidens, MD [facility]  05/28/2017

## 2017-05-29 LAB — THYROID PANEL WITH TSH
Free Thyroxine Index: 3.7 (ref 1.2–4.9)
T3 UPTAKE RATIO: 31 % (ref 24–39)
T4, Total: 11.8 ug/dL (ref 4.5–12.0)
TSH: 1.21 u[IU]/mL (ref 0.450–4.500)

## 2017-05-29 LAB — BASIC METABOLIC PANEL
BUN / CREAT RATIO: 24 (ref 12–28)
BUN: 18 mg/dL (ref 8–27)
CHLORIDE: 103 mmol/L (ref 96–106)
CO2: 21 mmol/L (ref 20–29)
Calcium: 9.6 mg/dL (ref 8.7–10.3)
Creatinine, Ser: 0.75 mg/dL (ref 0.57–1.00)
GFR calc non Af Amer: 80 mL/min/{1.73_m2} (ref >59)
GFR, EST AFRICAN AMERICAN: 92 mL/min/{1.73_m2} (ref >59)
Glucose: 86 mg/dL (ref 65–99)
POTASSIUM: 4.4 mmol/L (ref 3.5–5.2)
SODIUM: 140 mmol/L (ref 134–144)

## 2017-05-29 LAB — MAGNESIUM: Magnesium: 1.9 mg/dL (ref 1.6–2.3)

## 2017-06-05 DIAGNOSIS — I10 Essential (primary) hypertension: Secondary | ICD-10-CM | POA: Diagnosis not present

## 2017-06-05 DIAGNOSIS — I447 Left bundle-branch block, unspecified: Secondary | ICD-10-CM | POA: Diagnosis not present

## 2017-06-05 DIAGNOSIS — I5022 Chronic systolic (congestive) heart failure: Secondary | ICD-10-CM | POA: Diagnosis not present

## 2017-06-05 DIAGNOSIS — I509 Heart failure, unspecified: Secondary | ICD-10-CM | POA: Diagnosis not present

## 2017-06-05 DIAGNOSIS — Z6823 Body mass index (BMI) 23.0-23.9, adult: Secondary | ICD-10-CM | POA: Diagnosis not present

## 2017-07-08 ENCOUNTER — Other Ambulatory Visit: Payer: Self-pay

## 2017-07-23 DIAGNOSIS — R262 Difficulty in walking, not elsewhere classified: Secondary | ICD-10-CM | POA: Diagnosis not present

## 2017-08-08 DIAGNOSIS — I509 Heart failure, unspecified: Secondary | ICD-10-CM | POA: Diagnosis not present

## 2017-08-08 DIAGNOSIS — Z6823 Body mass index (BMI) 23.0-23.9, adult: Secondary | ICD-10-CM | POA: Diagnosis not present

## 2017-08-08 DIAGNOSIS — I5022 Chronic systolic (congestive) heart failure: Secondary | ICD-10-CM | POA: Diagnosis not present

## 2017-11-04 ENCOUNTER — Other Ambulatory Visit: Payer: Self-pay | Admitting: Internal Medicine

## 2017-11-18 DIAGNOSIS — Z23 Encounter for immunization: Secondary | ICD-10-CM | POA: Diagnosis not present

## 2017-11-19 ENCOUNTER — Other Ambulatory Visit: Payer: Self-pay | Admitting: Internal Medicine

## 2017-11-20 DIAGNOSIS — Z6823 Body mass index (BMI) 23.0-23.9, adult: Secondary | ICD-10-CM | POA: Diagnosis not present

## 2017-11-20 DIAGNOSIS — I509 Heart failure, unspecified: Secondary | ICD-10-CM | POA: Diagnosis not present

## 2017-11-20 DIAGNOSIS — I5022 Chronic systolic (congestive) heart failure: Secondary | ICD-10-CM | POA: Diagnosis not present

## 2017-12-08 ENCOUNTER — Other Ambulatory Visit: Payer: Self-pay | Admitting: Internal Medicine

## 2017-12-20 ENCOUNTER — Encounter: Payer: Self-pay | Admitting: Internal Medicine

## 2017-12-20 ENCOUNTER — Ambulatory Visit (INDEPENDENT_AMBULATORY_CARE_PROVIDER_SITE_OTHER): Payer: Medicare Other | Admitting: Internal Medicine

## 2017-12-20 VITALS — BP 124/68 | HR 77 | Temp 98.0°F | Ht 70.0 in | Wt 154.0 lb

## 2017-12-20 DIAGNOSIS — J0140 Acute pansinusitis, unspecified: Secondary | ICD-10-CM | POA: Diagnosis not present

## 2017-12-20 MED ORDER — AZITHROMYCIN 250 MG PO TABS: ORAL_TABLET | ORAL | 0 refills | Status: DC

## 2017-12-20 NOTE — Patient Instructions (Signed)
Mucinex - PLAIN twice a day

## 2017-12-20 NOTE — Progress Notes (Signed)
Date:  12/20/2017   Name:  Debbie Hubbard   DOB:  08-01-1946   MRN:  [id number]   Chief Complaint: Cough (lots of thick mucous with cough- brown and green mucous. Overall feel unwell- eyes dry and have a lot of discharge coming out of them. )  Sinusitis  This is a new problem. The current episode started in the past 7 days. The problem has been gradually worsening since onset. There has been no fever. The pain is mild. Associated symptoms include congestion, coughing, a hoarse voice, sinus pressure and a sore throat. Pertinent negatives include no chills, ear pain, headaches or shortness of breath. Past treatments include nothing.     Review of Systems  Constitutional: Positive for fatigue. Negative for chills, fever and unexpected weight change.  HENT: Positive for congestion, hoarse voice, sinus pressure and sore throat. Negative for ear pain.   Respiratory: Positive for cough. Negative for chest tightness, shortness of breath and wheezing.   Cardiovascular: Positive for chest pain (rib pain from coughing). Negative for palpitations.  Gastrointestinal: Negative for abdominal pain, diarrhea and vomiting.  Neurological: Negative for dizziness, syncope, light-headedness and headaches.  Hematological: Negative for adenopathy.    Patient Active Problem List   Diagnosis Date Noted  . Low blood magnesium 05/28/2017  . Systolic heart failure ([place]) 04/25/2017  . LBBB (left bundle branch block) 04/25/2017  . NSTEMI (non-ST elevated myocardial infarction) ([place]) 04/25/2017  . Hx-TIA (transient ischemic attack) 07/15/2015  . 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  . Hyperlipidemia, mild 07/15/2015  . Hot flash, menopausal 07/15/2015  . 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  . Venous insufficiency of leg 07/15/2015    Prior to Admission medications   Medication Sig Start Date End Date Taking? Authorizing Provider  albuterol (PROVENTIL HFA;VENTOLIN HFA) 108 (90 Base) MCG/ACT inhaler  Inhale 2 puffs into the lungs every 6 (six) hours as needed for wheezing or shortness of breath. 03/01/17  Yes Glean Hess, MD  ALPRAZolam Duanne Moron) 0.5 MG tablet TAKE 1 TABLET BY MOUTH AT BEDTIME 11/19/17  Yes Glean Hess, MD  carvedilol (COREG) 6.25 MG tablet Take 6.25 mg by mouth 2 (two) times daily. 05/07/17 12/20/17 Yes [provider]  conjugated estrogens-medroxyprogesteron (PREMPHASE) TABS tablet Take 1 tablet by mouth daily. 12/26/16  Yes Glean Hess, MD  lisinopril (PRINIVIL,ZESTRIL) 20 MG tablet Take 20 mg by mouth daily.   Yes [provider]  PREMARIN vaginal cream APPLY VAGINALLY ONCE A DAY AS DIRECTED 12/08/17  Yes Glean Hess, MD  SYNTHROID 150 MCG tablet TAKE 1 TABLET (150 MCG TOTAL) BY MOUTH DAILY. 04/25/17  Yes Glean Hess, MD  tretinoin (RETIN-A) 0.1 % cream APPLY TOPICALLY AT BEDTIME. 11/04/17  Yes Glean Hess, MD    Allergies  Allergen Reactions  . Bee Venom Swelling and Hives    Past Surgical History:  Procedure Laterality Date  . BREAST ENHANCEMENT SURGERY  2004  . LEFT HEART CATH AND CORONARY ANGIOGRAPHY  04/25/2017  . THYROIDECTOMY, PARTIAL  1998   [facility]    Social History   Tobacco Use  . Smoking status: Never Smoker  . Smokeless tobacco: Never Used  Substance Use Topics  . Alcohol use: No    Alcohol/week: 0.0 oz  . Drug use: No     Medication list has been reviewed and updated.  PHQ 2/9 Scores 05/28/2017 04/23/2016  PHQ - 2 Score 0 0    Physical Exam  Constitutional: She is oriented to person, place,  and time. She appears well-developed. No distress.  HENT:  Head: Normocephalic and atraumatic.  Right Ear: Tympanic membrane and ear canal normal.  Left Ear: Tympanic membrane and ear canal normal.  Nose: Right sinus exhibits maxillary sinus tenderness. Left sinus exhibits maxillary sinus tenderness.  Mouth/Throat: No posterior oropharyngeal edema or posterior oropharyngeal erythema.  Eyes: Conjunctivae  are normal.  Neck: Normal range of motion. Neck supple.  Cardiovascular: Normal rate, regular rhythm and normal heart sounds.  Pulmonary/Chest: Effort normal and breath sounds normal. No respiratory distress. She has no wheezes. She has no rhonchi.  Musculoskeletal: Normal range of motion. She exhibits no edema.  Neurological: She is alert and oriented to person, place, and time.  Skin: Skin is warm and dry. No rash noted.  Psychiatric: She has a normal mood and affect. Her behavior is normal. Thought content normal.  Nursing note and vitals reviewed.   BP 124/68   Pulse 77   Temp 98 F (36.7 C) (Oral)   Ht 5\' 10"  (1.778 m)   Wt 154 lb (69.9 kg)   LMP  (LMP Unknown)   SpO2 99%   BMI 22.10 kg/m   Assessment and Plan: 1. Acute non-recurrent pansinusitis Take mucinex Push fluids otc eye moisturizers - azithromycin (ZITHROMAX Z-PAK) 250 MG tablet; UAD  Dispense: 6 each; Refill: 0   Meds ordered this encounter  Medications  . azithromycin (ZITHROMAX Z-PAK) 250 MG tablet    Sig: UAD    Dispense:  6 each    Refill:  0    Partially dictated using Editor, commissioning. Any errors are unintentional.  Halina Maidens, MD [facility]  12/20/2017

## 2017-12-30 ENCOUNTER — Other Ambulatory Visit: Payer: Self-pay | Admitting: Internal Medicine

## 2017-12-30 ENCOUNTER — Telehealth: Payer: Self-pay

## 2017-12-30 MED ORDER — NYSTATIN 100000 UNIT/ML MT SUSP: 5.0000 mL | Freq: Four times a day (QID) | OROMUCOSAL | 0 refills | Status: DC

## 2017-12-30 NOTE — Telephone Encounter (Signed)
Patient called stating she was given antibiotics while here. Now has thick white patch on tongue that is painful- wanted to know if we can send in mouth wash to help this clear up. Please Advise.

## 2018-01-01 ENCOUNTER — Other Ambulatory Visit: Payer: Self-pay | Admitting: Internal Medicine

## 2018-01-03 ENCOUNTER — Telehealth: Payer: Self-pay

## 2018-01-03 NOTE — Telephone Encounter (Signed)
Continued to receive refills for Xanax after faxing twice.  [facility] [place] The pharmacist said she never received the Rx. Gave verbal for patients Rx. Xanax 0.5 mg tablets Dispense 30 with 5 refills.

## 2018-01-21 ENCOUNTER — Other Ambulatory Visit: Payer: Self-pay

## 2018-01-21 MED ORDER — CONJ ESTROG-MEDROXYPROGEST ACE PO TABS: 1.0000 | ORAL_TABLET | Freq: Every day | ORAL | 12 refills | Status: DC

## 2018-02-09 DIAGNOSIS — R10813 Right lower quadrant abdominal tenderness: Secondary | ICD-10-CM | POA: Diagnosis not present

## 2018-02-09 DIAGNOSIS — Z79899 Other long term (current) drug therapy: Secondary | ICD-10-CM | POA: Diagnosis not present

## 2018-02-09 DIAGNOSIS — I498 Other specified cardiac arrhythmias: Secondary | ICD-10-CM | POA: Diagnosis not present

## 2018-02-09 DIAGNOSIS — K8020 Calculus of gallbladder without cholecystitis without obstruction: Secondary | ICD-10-CM | POA: Diagnosis not present

## 2018-02-09 DIAGNOSIS — N132 Hydronephrosis with renal and ureteral calculous obstruction: Secondary | ICD-10-CM | POA: Diagnosis not present

## 2018-02-09 DIAGNOSIS — I447 Left bundle-branch block, unspecified: Secondary | ICD-10-CM | POA: Diagnosis not present

## 2018-02-09 DIAGNOSIS — R109 Unspecified abdominal pain: Secondary | ICD-10-CM | POA: Diagnosis not present

## 2018-02-09 DIAGNOSIS — Z5181 Encounter for therapeutic drug level monitoring: Secondary | ICD-10-CM | POA: Diagnosis not present

## 2018-02-09 DIAGNOSIS — N202 Calculus of kidney with calculus of ureter: Secondary | ICD-10-CM | POA: Diagnosis not present

## 2018-02-09 DIAGNOSIS — J90 Pleural effusion, not elsewhere classified: Secondary | ICD-10-CM | POA: Diagnosis not present

## 2018-02-09 DIAGNOSIS — R112 Nausea with vomiting, unspecified: Secondary | ICD-10-CM | POA: Diagnosis not present

## 2018-02-12 DIAGNOSIS — R109 Unspecified abdominal pain: Secondary | ICD-10-CM | POA: Diagnosis not present

## 2018-02-12 DIAGNOSIS — N201 Calculus of ureter: Secondary | ICD-10-CM | POA: Diagnosis not present

## 2018-02-17 DIAGNOSIS — R109 Unspecified abdominal pain: Secondary | ICD-10-CM | POA: Diagnosis not present

## 2018-02-17 DIAGNOSIS — N201 Calculus of ureter: Secondary | ICD-10-CM | POA: Diagnosis not present

## 2018-02-25 DIAGNOSIS — I509 Heart failure, unspecified: Secondary | ICD-10-CM | POA: Diagnosis not present

## 2018-02-25 DIAGNOSIS — Z6823 Body mass index (BMI) 23.0-23.9, adult: Secondary | ICD-10-CM | POA: Diagnosis not present

## 2018-02-25 DIAGNOSIS — I5022 Chronic systolic (congestive) heart failure: Secondary | ICD-10-CM | POA: Diagnosis not present

## 2018-02-28 DIAGNOSIS — N201 Calculus of ureter: Secondary | ICD-10-CM | POA: Diagnosis not present

## 2018-03-10 DIAGNOSIS — N201 Calculus of ureter: Secondary | ICD-10-CM | POA: Diagnosis not present

## 2018-03-17 HISTORY — PX: EXTRACORPOREAL SHOCK WAVE LITHOTRIPSY: SHX1557

## 2018-03-19 ENCOUNTER — Other Ambulatory Visit: Payer: Self-pay | Admitting: Internal Medicine

## 2018-04-19 ENCOUNTER — Other Ambulatory Visit: Payer: Self-pay | Admitting: Internal Medicine

## 2018-04-19 DIAGNOSIS — E033 Postinfectious hypothyroidism: Secondary | ICD-10-CM

## 2018-04-21 NOTE — Telephone Encounter (Signed)
Left a message

## 2018-06-18 DIAGNOSIS — Z6824 Body mass index (BMI) 24.0-24.9, adult: Secondary | ICD-10-CM | POA: Diagnosis not present

## 2018-06-18 DIAGNOSIS — I110 Hypertensive heart disease with heart failure: Secondary | ICD-10-CM | POA: Diagnosis not present

## 2018-06-18 DIAGNOSIS — I5022 Chronic systolic (congestive) heart failure: Secondary | ICD-10-CM | POA: Diagnosis not present

## 2018-06-18 DIAGNOSIS — I509 Heart failure, unspecified: Secondary | ICD-10-CM | POA: Diagnosis not present

## 2018-06-27 ENCOUNTER — Other Ambulatory Visit: Payer: Self-pay | Admitting: Internal Medicine

## 2018-06-27 DIAGNOSIS — E033 Postinfectious hypothyroidism: Secondary | ICD-10-CM

## 2018-08-08 ENCOUNTER — Ambulatory Visit (INDEPENDENT_AMBULATORY_CARE_PROVIDER_SITE_OTHER): Payer: Medicare Other | Admitting: Internal Medicine

## 2018-08-08 ENCOUNTER — Other Ambulatory Visit: Payer: Self-pay | Admitting: Internal Medicine

## 2018-08-08 ENCOUNTER — Encounter: Payer: Self-pay | Admitting: Internal Medicine

## 2018-08-08 VITALS — BP 118/64 | HR 60 | Ht 70.0 in | Wt 168.0 lb

## 2018-08-08 DIAGNOSIS — N200 Calculus of kidney: Secondary | ICD-10-CM | POA: Diagnosis not present

## 2018-08-08 DIAGNOSIS — Z1211 Encounter for screening for malignant neoplasm of colon: Secondary | ICD-10-CM

## 2018-08-08 DIAGNOSIS — E033 Postinfectious hypothyroidism: Secondary | ICD-10-CM

## 2018-08-08 DIAGNOSIS — I5022 Chronic systolic (congestive) heart failure: Secondary | ICD-10-CM | POA: Diagnosis not present

## 2018-08-08 DIAGNOSIS — R790 Abnormal level of blood mineral: Secondary | ICD-10-CM

## 2018-08-08 MED ORDER — CONJ ESTROG-MEDROXYPROGEST ACE PO TABS: 1.0000 | ORAL_TABLET | Freq: Every day | ORAL | 12 refills | Status: DC

## 2018-08-08 MED ORDER — ESTROGENS, CONJUGATED 0.625 MG/GM VA CREA: TOPICAL_CREAM | Freq: Every day | VAGINAL | 5 refills | Status: DC

## 2018-08-08 NOTE — Progress Notes (Signed)
Date:  08/08/2018   Name:  Debbie Hubbard   DOB:  1946-06-18   MRN:  [id number]   Chief Complaint: Hypothyroidism  Thyroid Problem  Presents for follow-up visit. Patient reports no anxiety, constipation, diarrhea, fatigue, hoarse voice, palpitations, weight gain or weight loss. The symptoms have been stable.   Lab Results  Component Value Date   TSH 1.210 05/28/2017   CHF - therapy now optimized, no edema, CP and SOB.  Recently seen and had BMP - normal.  No other labs done.  CRC screening - does not want colonoscopy.  Would agree to [employer]. No intestinal issues, bleeding, pain.   Renal stones - had severe attack this spring.  Had lithotripsy but still have some stones.  She is taking an herbal supplement and has had no further issues.  Seeing Urology for follow up next month.  Review of Systems  Constitutional: Negative for chills, fatigue, unexpected weight change, weight gain and weight loss.  HENT: Negative for hoarse voice and trouble swallowing.   Eyes: Negative for visual disturbance.  Respiratory: Negative for cough, chest tightness, shortness of breath and stridor.   Cardiovascular: Negative for palpitations.  Gastrointestinal: Negative for constipation and diarrhea.  Musculoskeletal: Negative for arthralgias and gait problem.  Neurological: Negative for dizziness, light-headedness and headaches.  Psychiatric/Behavioral: Negative for dysphoric mood and sleep disturbance. The patient is not nervous/anxious.     Patient Active Problem List   Diagnosis Date Noted  . Chronic systolic heart failure ([place]) 08/08/2018  . Low blood magnesium 05/28/2017  . LBBB (left bundle branch block) 04/25/2017  . Hx-TIA (transient ischemic attack) 07/15/2015  . 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  . Hyperlipidemia, mild 07/15/2015  . Hot flash, menopausal 07/15/2015  . 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  . Venous insufficiency of leg 07/15/2015    Allergies    Allergen Reactions  . Bee Venom Swelling and Hives    Past Surgical History:  Procedure Laterality Date  . BREAST ENHANCEMENT SURGERY  2004  . LEFT HEART CATH AND CORONARY ANGIOGRAPHY  04/25/2017  . THYROIDECTOMY, PARTIAL  1998   [facility]    Social History   Tobacco Use  . Smoking status: Never Smoker  . Smokeless tobacco: Never Used  Substance Use Topics  . Alcohol use: No    Alcohol/week: 0.0 standard drinks  . Drug use: No     Medication list has been reviewed and updated.  Current Meds  Medication Sig  . albuterol (PROVENTIL HFA;VENTOLIN HFA) 108 (90 Base) MCG/ACT inhaler Inhale 2 puffs into the lungs every 6 (six) hours as needed for wheezing or shortness of breath.  . ALPRAZolam (XANAX) 0.5 MG tablet TAKE 1 TABLET BY MOUTH AT BEDTIME  . carvedilol (COREG) 12.5 MG tablet Take 1.5 tablets by mouth 2 (two) times daily.  Marland Kitchen conjugated estrogens-medroxyprogesteron (PREMPHASE) TABS tablet Take 1 tablet by mouth daily.  . furosemide (LASIX) 20 MG tablet Take by mouth.  Marland Kitchen lisinopril (PRINIVIL,ZESTRIL) 20 MG tablet Take by mouth 2 (two) times daily.  Marland Kitchen nystatin (MYCOSTATIN) 100000 UNIT/ML suspension Take 5 mLs (500,000 Units total) by mouth 4 (four) times daily. Swish, gargle and spit  . potassium chloride (K-DUR,KLOR-CON) 10 MEQ tablet Take by mouth.  Marland Kitchen PREMARIN vaginal cream APPLY VAGINALLY ONCE A DAY AS DIRECTED  . SYNTHROID 150 MCG tablet TAKE 1 TABLET BY MOUTH EVERY DAY  . tretinoin (RETIN-A) 0.1 % cream APPLY TO AFFECTED AREA EVERY DAY AT BEDTIME  . vitamin B-12 (CYANOCOBALAMIN) 1000 MCG tablet  Take by mouth.    PHQ 2/9 Scores 08/08/2018 05/28/2017 04/23/2016  PHQ - 2 Score 0 0 0    Physical Exam  Constitutional: She is oriented to person, place, and time. She appears well-developed. No distress.  HENT:  Head: Normocephalic and atraumatic.  Neck: Normal range of motion. Neck supple.  Cardiovascular: Normal rate, regular rhythm and normal heart sounds.  Pulmonary/Chest:  Effort normal and breath sounds normal. No respiratory distress. She has no wheezes. She has no rales.  Musculoskeletal: Normal range of motion. She exhibits no edema or tenderness.  Lymphadenopathy:    She has no cervical adenopathy.  Neurological: She is alert and oriented to person, place, and time.  Skin: Skin is warm and dry. No rash noted.  Psychiatric: She has a normal mood and affect. Her behavior is normal. Thought content normal.  Nursing note and vitals reviewed.   BP 118/64 (BP Location: Right Arm, Patient Position: Sitting, Cuff Size: Normal)   Pulse 60   Ht 5\' 10"  (1.778 m)   Wt 168 lb (76.2 kg)   LMP  (LMP Unknown)   SpO2 98%   BMI 24.11 kg/m   Assessment and Plan: 1. Post-infectious hypothyroidism supplemented - TSH  2. Chronic systolic heart failure (HCC) Doing well, continue cardiology follow up - CBC with Differential/Platelet - Lipid panel - Comprehensive metabolic panel  3. Low blood magnesium Supplemented, check levels - Magnesium  4. Renal stones S/p lithotripsy Follow up with Urology  5. Colon cancer screening - Cologuard   No orders of the defined types were placed in this encounter.   Partially dictated using Editor, commissioning. Any errors are unintentional.  Halina Maidens, MD [facility]  08/08/2018

## 2018-08-09 LAB — CBC WITH DIFFERENTIAL/PLATELET
BASOS ABS: 0.1 10*3/uL (ref 0.0–0.2)
BASOS: 1 %
EOS (ABSOLUTE): 0.2 10*3/uL (ref 0.0–0.4)
Eos: 3 %
Hematocrit: 41.4 % (ref 34.0–46.6)
Hemoglobin: 14 g/dL (ref 11.1–15.9)
IMMATURE GRANULOCYTES: 0 %
Immature Grans (Abs): 0 10*3/uL (ref 0.0–0.1)
Lymphocytes Absolute: 1.7 10*3/uL (ref 0.7–3.1)
Lymphs: 24 %
MCH: 32.9 pg (ref 26.6–33.0)
MCHC: 33.8 g/dL (ref 31.5–35.7)
MCV: 97 fL (ref 79–97)
MONOS ABS: 0.5 10*3/uL (ref 0.1–0.9)
Monocytes: 7 %
Neutrophils Absolute: 4.7 10*3/uL (ref 1.4–7.0)
Neutrophils: 65 %
PLATELETS: 268 10*3/uL (ref 150–450)
RBC: 4.26 x10E6/uL (ref 3.77–5.28)
RDW: 12.1 % — AB (ref 12.3–15.4)
WBC: 7 10*3/uL (ref 3.4–10.8)

## 2018-08-09 LAB — COMPREHENSIVE METABOLIC PANEL
ALT: 17 IU/L (ref 0–32)
AST: 20 IU/L (ref 0–40)
Albumin/Globulin Ratio: 1.4 (ref 1.2–2.2)
Albumin: 4.3 g/dL (ref 3.5–4.8)
Alkaline Phosphatase: 48 IU/L (ref 39–117)
BUN/Creatinine Ratio: 31 — ABNORMAL HIGH (ref 12–28)
BUN: 23 mg/dL (ref 8–27)
Bilirubin Total: 0.4 mg/dL (ref 0.0–1.2)
CALCIUM: 9.6 mg/dL (ref 8.7–10.3)
CO2: 24 mmol/L (ref 20–29)
Chloride: 100 mmol/L (ref 96–106)
Creatinine, Ser: 0.75 mg/dL (ref 0.57–1.00)
GFR calc non Af Amer: 79 mL/min/{1.73_m2} (ref >59)
GFR, EST AFRICAN AMERICAN: 91 mL/min/{1.73_m2} (ref >59)
Globulin, Total: 3.1 g/dL (ref 1.5–4.5)
Glucose: 86 mg/dL (ref 65–99)
Potassium: 4.7 mmol/L (ref 3.5–5.2)
Sodium: 138 mmol/L (ref 134–144)
TOTAL PROTEIN: 7.4 g/dL (ref 6.0–8.5)

## 2018-08-09 LAB — LIPID PANEL
CHOL/HDL RATIO: 2.9 ratio (ref 0.0–4.4)
Cholesterol, Total: 211 mg/dL — ABNORMAL HIGH (ref 100–199)
HDL: 73 mg/dL (ref >39)
LDL Calculated: 106 mg/dL — ABNORMAL HIGH (ref 0–99)
TRIGLYCERIDES: 159 mg/dL — AB (ref 0–149)
VLDL Cholesterol Cal: 32 mg/dL (ref 5–40)

## 2018-08-09 LAB — MAGNESIUM: Magnesium: 2.1 mg/dL (ref 1.6–2.3)

## 2018-08-09 LAB — TSH: TSH: 1.82 u[IU]/mL (ref 0.450–4.500)

## 2018-08-15 ENCOUNTER — Other Ambulatory Visit: Payer: Self-pay | Admitting: Internal Medicine

## 2018-08-27 ENCOUNTER — Ambulatory Visit: Payer: Medicare Other

## 2018-09-08 DIAGNOSIS — Z1211 Encounter for screening for malignant neoplasm of colon: Secondary | ICD-10-CM | POA: Diagnosis not present

## 2018-09-08 LAB — COLOGUARD

## 2018-09-15 ENCOUNTER — Telehealth: Payer: Self-pay | Admitting: Internal Medicine

## 2018-09-15 ENCOUNTER — Other Ambulatory Visit: Payer: Self-pay | Admitting: Internal Medicine

## 2018-09-15 DIAGNOSIS — Z1211 Encounter for screening for malignant neoplasm of colon: Secondary | ICD-10-CM

## 2018-09-15 NOTE — Telephone Encounter (Signed)
Patient informed. Will call back if has not heard anything by the end of next week.

## 2018-09-15 NOTE — Telephone Encounter (Signed)
Patient informed of positive cologuard. Agree's to colonoscopy and agree's to see Dr Comer Locket ASAP.

## 2018-09-15 NOTE — Telephone Encounter (Signed)
Need to be referred for colonoscopy. What GI does she prefer?  I use Dr. Comer Locket in [place].

## 2018-09-15 NOTE — Telephone Encounter (Signed)
Referral done

## 2018-09-15 NOTE — Telephone Encounter (Signed)
LVMTCB

## 2018-09-23 ENCOUNTER — Other Ambulatory Visit: Payer: Self-pay | Admitting: Internal Medicine

## 2018-09-23 DIAGNOSIS — E033 Postinfectious hypothyroidism: Secondary | ICD-10-CM

## 2018-10-29 DIAGNOSIS — I428 Other cardiomyopathies: Secondary | ICD-10-CM | POA: Diagnosis not present

## 2018-10-29 DIAGNOSIS — I5022 Chronic systolic (congestive) heart failure: Secondary | ICD-10-CM | POA: Diagnosis not present

## 2018-10-29 DIAGNOSIS — I509 Heart failure, unspecified: Secondary | ICD-10-CM | POA: Diagnosis not present

## 2018-10-29 DIAGNOSIS — Z6824 Body mass index (BMI) 24.0-24.9, adult: Secondary | ICD-10-CM | POA: Diagnosis not present

## 2018-11-25 ENCOUNTER — Other Ambulatory Visit: Payer: Self-pay | Admitting: Internal Medicine

## 2018-12-22 ENCOUNTER — Other Ambulatory Visit: Payer: Self-pay | Admitting: Internal Medicine

## 2018-12-22 DIAGNOSIS — E033 Postinfectious hypothyroidism: Secondary | ICD-10-CM

## 2018-12-29 ENCOUNTER — Other Ambulatory Visit: Payer: Self-pay | Admitting: Internal Medicine

## 2019-03-01 DIAGNOSIS — N3000 Acute cystitis without hematuria: Secondary | ICD-10-CM | POA: Diagnosis not present

## 2019-03-14 ENCOUNTER — Other Ambulatory Visit: Payer: Self-pay | Admitting: Internal Medicine

## 2019-03-14 DIAGNOSIS — E033 Postinfectious hypothyroidism: Secondary | ICD-10-CM

## 2019-03-16 NOTE — Telephone Encounter (Signed)
LM to call back.

## 2019-04-23 ENCOUNTER — Other Ambulatory Visit: Payer: Self-pay | Admitting: Internal Medicine

## 2019-05-03 ENCOUNTER — Other Ambulatory Visit: Payer: Self-pay | Admitting: Internal Medicine

## 2019-05-06 DIAGNOSIS — E079 Disorder of thyroid, unspecified: Secondary | ICD-10-CM | POA: Diagnosis not present

## 2019-05-06 DIAGNOSIS — E349 Endocrine disorder, unspecified: Secondary | ICD-10-CM | POA: Diagnosis not present

## 2019-05-06 DIAGNOSIS — R946 Abnormal results of thyroid function studies: Secondary | ICD-10-CM | POA: Diagnosis not present

## 2019-05-06 DIAGNOSIS — Z1159 Encounter for screening for other viral diseases: Secondary | ICD-10-CM | POA: Diagnosis not present

## 2019-05-06 DIAGNOSIS — Z131 Encounter for screening for diabetes mellitus: Secondary | ICD-10-CM | POA: Diagnosis not present

## 2019-05-06 DIAGNOSIS — E559 Vitamin D deficiency, unspecified: Secondary | ICD-10-CM | POA: Diagnosis not present

## 2019-05-06 DIAGNOSIS — Z13228 Encounter for screening for other metabolic disorders: Secondary | ICD-10-CM | POA: Diagnosis not present

## 2019-05-06 DIAGNOSIS — R6882 Decreased libido: Secondary | ICD-10-CM | POA: Diagnosis not present

## 2019-05-06 DIAGNOSIS — E7800 Pure hypercholesterolemia, unspecified: Secondary | ICD-10-CM | POA: Diagnosis not present

## 2019-05-13 DIAGNOSIS — N951 Menopausal and female climacteric states: Secondary | ICD-10-CM | POA: Diagnosis not present

## 2019-05-13 DIAGNOSIS — E559 Vitamin D deficiency, unspecified: Secondary | ICD-10-CM | POA: Diagnosis not present

## 2019-05-13 DIAGNOSIS — R5383 Other fatigue: Secondary | ICD-10-CM | POA: Diagnosis not present

## 2019-05-13 DIAGNOSIS — Z0184 Encounter for antibody response examination: Secondary | ICD-10-CM | POA: Diagnosis not present

## 2019-05-13 DIAGNOSIS — E649 Sequelae of unspecified nutritional deficiency: Secondary | ICD-10-CM | POA: Diagnosis not present

## 2019-05-13 DIAGNOSIS — N926 Irregular menstruation, unspecified: Secondary | ICD-10-CM | POA: Diagnosis not present

## 2019-05-20 DIAGNOSIS — I447 Left bundle-branch block, unspecified: Secondary | ICD-10-CM | POA: Diagnosis not present

## 2019-05-20 DIAGNOSIS — I5022 Chronic systolic (congestive) heart failure: Secondary | ICD-10-CM | POA: Diagnosis not present

## 2019-05-20 DIAGNOSIS — I110 Hypertensive heart disease with heart failure: Secondary | ICD-10-CM | POA: Diagnosis not present

## 2019-06-06 ENCOUNTER — Other Ambulatory Visit: Payer: Self-pay | Admitting: Internal Medicine

## 2019-07-13 ENCOUNTER — Encounter: Payer: Medicare Other | Admitting: Internal Medicine

## 2019-07-24 ENCOUNTER — Other Ambulatory Visit: Payer: Self-pay | Admitting: Internal Medicine

## 2019-07-28 ENCOUNTER — Other Ambulatory Visit: Payer: Self-pay

## 2019-07-28 DIAGNOSIS — E033 Postinfectious hypothyroidism: Secondary | ICD-10-CM

## 2019-07-28 MED ORDER — LEVOTHYROXINE SODIUM 150 MCG PO TABS: 150.0000 ug | ORAL_TABLET | Freq: Every day | ORAL | 0 refills | Status: DC

## 2019-08-10 ENCOUNTER — Other Ambulatory Visit: Payer: Self-pay | Admitting: Internal Medicine

## 2019-09-22 ENCOUNTER — Other Ambulatory Visit: Payer: Self-pay | Admitting: Internal Medicine

## 2019-09-25 ENCOUNTER — Encounter: Payer: Medicare Other | Admitting: Internal Medicine

## 2019-09-29 ENCOUNTER — Other Ambulatory Visit: Payer: Self-pay | Admitting: Internal Medicine

## 2019-09-29 DIAGNOSIS — E033 Postinfectious hypothyroidism: Secondary | ICD-10-CM

## 2019-10-01 ENCOUNTER — Telehealth: Payer: Self-pay | Admitting: Internal Medicine

## 2019-10-01 NOTE — Telephone Encounter (Signed)
°  Called to schedule Medicare Annual Wellness Visit with Nurse Health Advisor, Mather. If patient returns call, please schedule AWV with NHA ~Any date~  Questions regarding scheduling, please call  [phone] or Skype > [email or web address]   [place]  ??[email or web address]   ??[id number]   1Skype

## 2019-10-09 ENCOUNTER — Ambulatory Visit: Payer: Medicare Other | Admitting: Internal Medicine

## 2019-10-14 ENCOUNTER — Encounter: Payer: Self-pay | Admitting: Internal Medicine

## 2019-10-14 ENCOUNTER — Ambulatory Visit (INDEPENDENT_AMBULATORY_CARE_PROVIDER_SITE_OTHER): Payer: Medicare Other | Admitting: Internal Medicine

## 2019-10-14 ENCOUNTER — Other Ambulatory Visit: Payer: Self-pay

## 2019-10-14 VITALS — BP 124/80 | HR 78 | Ht 70.0 in | Wt 163.0 lb

## 2019-10-14 DIAGNOSIS — R300 Dysuria: Secondary | ICD-10-CM | POA: Diagnosis not present

## 2019-10-14 DIAGNOSIS — E033 Postinfectious hypothyroidism: Secondary | ICD-10-CM

## 2019-10-14 DIAGNOSIS — E785 Hyperlipidemia, unspecified: Secondary | ICD-10-CM

## 2019-10-14 DIAGNOSIS — I5022 Chronic systolic (congestive) heart failure: Secondary | ICD-10-CM

## 2019-10-14 DIAGNOSIS — Z23 Encounter for immunization: Secondary | ICD-10-CM | POA: Diagnosis not present

## 2019-10-14 LAB — POCT URINALYSIS DIPSTICK
Bilirubin, UA: NEGATIVE
Glucose, UA: NEGATIVE
Ketones, UA: NEGATIVE
Nitrite, UA: POSITIVE
Protein, UA: NEGATIVE
Spec Grav, UA: 1.02 (ref 1.010–1.025)
Urobilinogen, UA: 0.2 E.U./dL
pH, UA: 5 (ref 5.0–8.0)

## 2019-10-14 MED ORDER — SULFAMETHOXAZOLE-TRIMETHOPRIM 800-160 MG PO TABS: 1.0000 | ORAL_TABLET | Freq: Two times a day (BID) | ORAL | 0 refills | Status: AC

## 2019-10-14 NOTE — Progress Notes (Signed)
Date:  10/14/2019   Name:  Debbie Hubbard   DOB:  08/30/1946   MRN:  [id number]   Chief Complaint: Annual Exam Debbie Hubbard is a 73 y.o. female who presents today for her annual exam. She feels well. She reports exercising walking daily. She reports she is sleeping well. She denies breast issues.  She has implants which are firm and mammograms are painful.  Mammogram 12/2016 - pt wishes to stop screenings Cologuard 08/2018 DEXA 2012 Immunizations UTD  Thyroid Problem Presents for follow-up visit. Patient reports no anxiety, constipation, diaphoresis, diarrhea, fatigue, hoarse voice, leg swelling, palpitations, tremors or weight gain. The symptoms have been stable.   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 Repeat TTE August 2018 LVEF 40-45% - NYHA Class I-II, Stage C - Current HF meds: carvedilol 18.75 mg BID, lisinopril 20 mg BID - Did not tolerate higher dose carvedilol due to fatigue and dizziness - Not on spironolactone due to GI symptoms - Current diuretic: furosemide only as needed - Med changes: none. She is on maximally tolerated doses - Encouraged to continue exercising - She states she will have copy of most recent labs faxed to [place]  Review of Systems  Constitutional: Negative for chills, diaphoresis, fatigue, fever and weight gain.  HENT: Negative for congestion, hearing loss, hoarse voice, tinnitus, trouble swallowing and voice change.   Eyes: Negative for visual disturbance.  Respiratory: Negative for cough, chest tightness, shortness of breath and wheezing.   Cardiovascular: Negative for chest pain, palpitations and leg swelling.  Gastrointestinal: Negative for abdominal pain, constipation, diarrhea and vomiting.  Endocrine: Negative for polydipsia and polyuria.  Genitourinary: Negative for dysuria, frequency, genital sores, vaginal bleeding and vaginal discharge.  Musculoskeletal: Negative  for arthralgias, gait problem and joint swelling.  Skin: Negative for color change and rash.  Neurological: Negative for dizziness, tremors, light-headedness and headaches.  Hematological: Negative for adenopathy. Does not bruise/bleed easily.  Psychiatric/Behavioral: Negative for dysphoric mood and sleep disturbance. The patient is not nervous/anxious.     Patient Active Problem List   Diagnosis Date Noted  . Chronic systolic heart failure ([place]) 08/08/2018  . Low blood magnesium 05/28/2017  . LBBB (left bundle branch block) 04/25/2017  . Hx-TIA (transient ischemic attack) 07/15/2015  . 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  . Hyperlipidemia, mild 07/15/2015  . Hot flash, menopausal 07/15/2015  . 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  . Venous insufficiency of leg 07/15/2015    Allergies  Allergen Reactions  . Bee Venom Swelling and Hives    Past Surgical History:  Procedure Laterality Date  . BREAST ENHANCEMENT SURGERY  2004  . EXTRACORPOREAL SHOCK WAVE LITHOTRIPSY  03/2018  . LEFT HEART CATH AND CORONARY ANGIOGRAPHY  04/25/2017  . THYROIDECTOMY, PARTIAL  1998   [facility]    Social History   Tobacco Use  . Smoking status: Never Smoker  . Smokeless tobacco: Never Used  Substance Use Topics  . Alcohol use: No    Alcohol/week: 0.0 standard drinks  . Drug use: No     Medication list has been reviewed and updated.  Current Meds  Medication Sig  . ALPRAZolam (XANAX) 0.5 MG tablet TAKE 1 TABLET BY MOUTH EVERYDAY AT BEDTIME  . carvedilol (COREG) 12.5 MG tablet Take 1.5 tablets by mouth 2 (two) times daily.  Marland Kitchen lisinopril (ZESTRIL) 20 MG tablet Take 20 mg by mouth 2 (two) times daily.  Marland Kitchen PREMARIN vaginal cream PLACE VAGINALLY DAILY.  Marland Kitchen  PREMPHASE 0.625-5 MG TABS tablet TAKE 1 TABLET BY MOUTH EVERY DAY  . RETIN-A 0.1 % cream APPLY TO AFFECTED AREA EVERY DAY AT BEDTIME  . SYNTHROID 150 MCG tablet TAKE 1 TABLET BY MOUTH EVERY DAY  . vitamin B-12 (CYANOCOBALAMIN) 1000 MCG  tablet Take by mouth.    PHQ 2/9 Scores 10/14/2019 08/08/2018 05/28/2017 04/23/2016  PHQ - 2 Score 0 0 0 0  PHQ- 9 Score 0 - - -    BP Readings from Last 3 Encounters:  10/14/19 124/80  08/08/18 118/64  12/20/17 124/68    Physical Exam Vitals signs and nursing note reviewed.  Constitutional:      General: She is not in acute distress.    Appearance: She is well-developed.  HENT:     Head: Normocephalic and atraumatic.     Right Ear: Tympanic membrane and ear canal normal.     Left Ear: Tympanic membrane and ear canal normal.     Nose:     Right Sinus: No maxillary sinus tenderness.     Left Sinus: No maxillary sinus tenderness.  Eyes:     General: No scleral icterus.       Right eye: No discharge.        Left eye: No discharge.     Conjunctiva/sclera: Conjunctivae normal.  Neck:     Musculoskeletal: Normal range of motion. No erythema.     Thyroid: No thyromegaly.     Vascular: No carotid bruit.  Cardiovascular:     Rate and Rhythm: Normal rate and regular rhythm.     Pulses: Normal pulses.     Heart sounds: Normal heart sounds.  Pulmonary:     Effort: Pulmonary effort is normal. No respiratory distress.     Breath sounds: No wheezing.  Chest:     Breasts:        Right: No mass, nipple discharge, skin change or tenderness.        Left: No mass, nipple discharge, skin change or tenderness.     Comments: Implants firm but mobile, mildly tender to palpation Abdominal:     General: Bowel sounds are normal.     Palpations: Abdomen is soft.     Tenderness: There is no abdominal tenderness.  Musculoskeletal: Normal range of motion.  Lymphadenopathy:     Cervical: No cervical adenopathy.  Skin:    General: Skin is warm and dry.     Findings: No rash.  Neurological:     Mental Status: She is alert and oriented to person, place, and time.     Cranial Nerves: No cranial nerve deficit.     Sensory: No sensory deficit.     Deep Tendon Reflexes: Reflexes are normal and  symmetric.  Psychiatric:        Attention and Perception: Attention normal.        Mood and Affect: Mood normal.        Speech: Speech normal.        Behavior: Behavior normal.        Thought Content: Thought content normal.        Cognition and Memory: Cognition normal.     Wt Readings from Last 3 Encounters:  10/14/19 163 lb (73.9 kg)  08/08/18 168 lb (76.2 kg)  12/20/17 154 lb (69.9 kg)    BP 124/80   Pulse 78   Ht 5\' 10"  (1.778 m)   Wt 163 lb (73.9 kg)   LMP  (LMP Unknown)   SpO2 98%  BMI 23.39 kg/m   Assessment and Plan: 1. Post-infectious hypothyroidism Supplemented - pt feels well with no complaints.   Will check labs and advise. - TSH + free T4  2. Hyperlipidemia, mild Not on any statin therapy Continue healthy diet, exercise - Lipid panel  3. Chronic systolic heart failure ([place]) Followed by Cardiology - medications are stable EF improved at last check to 45% No requiring prn lasix in months Continue Coreg and Lisinopril 20 mg bid - CBC with Differential/Platelet - Comprehensive metabolic panel  4. Dysuria Mild sx for which she takes a daily cranberry tablet Evidence of infection on UA so will treat with 5 days of Bactrim - POCT urinalysis dipstick - sulfamethoxazole-trimethoprim (BACTRIM DS) 800-160 MG tablet; Take 1 tablet by mouth 2 (two) times daily for 5 days.  Dispense: 10 tablet; Refill: 0   Partially dictated using Editor, commissioning. Any errors are unintentional.  Halina Maidens, MD [facility]  10/14/2019

## 2019-10-15 LAB — CBC WITH DIFFERENTIAL/PLATELET
Basophils Absolute: 0.1 10*3/uL (ref 0.0–0.2)
Basos: 1 %
EOS (ABSOLUTE): 0.2 10*3/uL (ref 0.0–0.4)
Eos: 2 %
Hematocrit: 43 % (ref 34.0–46.6)
Hemoglobin: 14.6 g/dL (ref 11.1–15.9)
Immature Grans (Abs): 0 10*3/uL (ref 0.0–0.1)
Immature Granulocytes: 0 %
Lymphocytes Absolute: 1.8 10*3/uL (ref 0.7–3.1)
Lymphs: 18 %
MCH: 32.6 pg (ref 26.6–33.0)
MCHC: 34 g/dL (ref 31.5–35.7)
MCV: 96 fL (ref 79–97)
Monocytes Absolute: 0.6 10*3/uL (ref 0.1–0.9)
Monocytes: 6 %
Neutrophils Absolute: 7.1 10*3/uL — ABNORMAL HIGH (ref 1.4–7.0)
Neutrophils: 73 %
Platelets: 275 10*3/uL (ref 150–450)
RBC: 4.48 x10E6/uL (ref 3.77–5.28)
RDW: 12.4 % (ref 11.7–15.4)
WBC: 9.7 10*3/uL (ref 3.4–10.8)

## 2019-10-15 LAB — COMPREHENSIVE METABOLIC PANEL
ALT: 19 IU/L (ref 0–32)
AST: 21 IU/L (ref 0–40)
Albumin/Globulin Ratio: 1.4 (ref 1.2–2.2)
Albumin: 4.2 g/dL (ref 3.7–4.7)
Alkaline Phosphatase: 56 IU/L (ref 39–117)
BUN/Creatinine Ratio: 26 (ref 12–28)
BUN: 22 mg/dL (ref 8–27)
Bilirubin Total: 0.4 mg/dL (ref 0.0–1.2)
CO2: 22 mmol/L (ref 20–29)
Calcium: 9.6 mg/dL (ref 8.7–10.3)
Chloride: 105 mmol/L (ref 96–106)
Creatinine, Ser: 0.85 mg/dL (ref 0.57–1.00)
GFR calc Af Amer: 78 mL/min/{1.73_m2} (ref >59)
GFR calc non Af Amer: 68 mL/min/{1.73_m2} (ref >59)
Globulin, Total: 2.9 g/dL (ref 1.5–4.5)
Glucose: 85 mg/dL (ref 65–99)
Potassium: 4.6 mmol/L (ref 3.5–5.2)
Sodium: 141 mmol/L (ref 134–144)
Total Protein: 7.1 g/dL (ref 6.0–8.5)

## 2019-10-15 LAB — LIPID PANEL
Chol/HDL Ratio: 3.1 ratio (ref 0.0–4.4)
Cholesterol, Total: 200 mg/dL — ABNORMAL HIGH (ref 100–199)
HDL: 64 mg/dL (ref >39)
LDL Chol Calc (NIH): 109 mg/dL — ABNORMAL HIGH (ref 0–99)
Triglycerides: 157 mg/dL — ABNORMAL HIGH (ref 0–149)
VLDL Cholesterol Cal: 27 mg/dL (ref 5–40)

## 2019-10-15 LAB — TSH+FREE T4
Free T4: 1.81 ng/dL — ABNORMAL HIGH (ref 0.82–1.77)
TSH: 0.902 u[IU]/mL (ref 0.450–4.500)

## 2019-10-27 ENCOUNTER — Other Ambulatory Visit: Payer: Self-pay | Admitting: Internal Medicine

## 2019-10-27 DIAGNOSIS — E033 Postinfectious hypothyroidism: Secondary | ICD-10-CM

## 2019-11-06 ENCOUNTER — Other Ambulatory Visit: Payer: Self-pay | Admitting: Internal Medicine

## 2019-11-10 ENCOUNTER — Other Ambulatory Visit: Payer: Self-pay

## 2019-11-18 DIAGNOSIS — I5022 Chronic systolic (congestive) heart failure: Secondary | ICD-10-CM | POA: Diagnosis not present

## 2019-11-19 DIAGNOSIS — I447 Left bundle-branch block, unspecified: Secondary | ICD-10-CM | POA: Diagnosis not present

## 2019-11-19 DIAGNOSIS — Z6824 Body mass index (BMI) 24.0-24.9, adult: Secondary | ICD-10-CM | POA: Diagnosis not present

## 2019-11-19 DIAGNOSIS — I5022 Chronic systolic (congestive) heart failure: Secondary | ICD-10-CM | POA: Diagnosis not present

## 2019-11-19 DIAGNOSIS — I428 Other cardiomyopathies: Secondary | ICD-10-CM | POA: Diagnosis not present

## 2019-12-03 ENCOUNTER — Telehealth: Payer: Self-pay

## 2019-12-04 NOTE — Telephone Encounter (Signed)
Tried calling pt to follow up on positive cologuard from last year. Also need to do a depression and falls screening for [facility]. Awaiting call back.   Benedict Needy, CMA

## 2019-12-22 ENCOUNTER — Encounter: Payer: Medicare Other | Admitting: Internal Medicine

## 2020-06-06 ENCOUNTER — Other Ambulatory Visit: Payer: Self-pay | Admitting: Internal Medicine

## 2020-06-06 NOTE — Telephone Encounter (Signed)
Requested Prescriptions  Pending Prescriptions Disp Refills  . PREMARIN vaginal cream [Pharmacy Med Name: PREMARIN VAGINAL CREAM-APPL] 30 g 5    Sig: PLACE VAGINALLY DAILY.     OB/GYN:  Estrogens Failed - 06/06/2020  1:07 AM      Failed - Mammogram is up-to-date per Health Maintenance      Passed - Last BP in normal range    BP Readings from Last 1 Encounters:  10/14/19 124/80         Passed - Valid encounter within last 12 months    Recent Outpatient Visits          7 months ago Post-infectious hypothyroidism   [facility] Glean Hess, MD   1 year ago Post-infectious hypothyroidism   [facility] Glean Hess, MD   2 years ago Acute non-recurrent pansinusitis   [facility] Glean Hess, MD   3 years ago Post-infectious hypothyroidism   [facility] Glean Hess, MD   3 years ago Congestion of nasal sinus   [facility] Glean Hess, MD

## 2020-06-13 ENCOUNTER — Telehealth: Payer: Self-pay | Admitting: Internal Medicine

## 2020-06-13 NOTE — Telephone Encounter (Signed)
Requested medication (s) are due for refill today: yes  Requested medication (s) are on the active medication list: yes  Last refill:  11/07/19 #30 with 5 refills  Future visit scheduled: no  Notes to clinic:  Refill not delegated per protocol    Requested Prescriptions  Pending Prescriptions Disp Refills   ALPRAZolam (XANAX) 0.5 MG tablet [Pharmacy Med Name: ALPRAZOLAM 0.5 MG TABLET] 30 tablet     Sig: TAKE 1 TABLET BY MOUTH EVERYDAY AT BEDTIME      Not Delegated - Psychiatry:  Anxiolytics/Hypnotics Failed - 06/13/2020  5:06 PM      Failed - This refill cannot be delegated      Failed - Urine Drug Screen completed in last 360 days.      Failed - Valid encounter within last 6 months    Recent Outpatient Visits           8 months ago Post-infectious hypothyroidism   [facility] Glean Hess, MD   1 year ago Post-infectious hypothyroidism   [facility] Glean Hess, MD   2 years ago Acute non-recurrent pansinusitis   [facility] Glean Hess, MD   3 years ago Post-infectious hypothyroidism   [facility] Glean Hess, MD   3 years ago Congestion of nasal sinus   [facility] Glean Hess, MD

## 2020-06-30 LAB — HEMOGLOBIN A1C: Hemoglobin A1C: 5.5

## 2020-06-30 LAB — LIPID PANEL
Cholesterol: 178 (ref 0–200)
HDL: 51 (ref 35–70)
LDL Cholesterol: 97
Triglycerides: 175 — AB (ref 40–160)

## 2020-06-30 LAB — TSH: TSH: 0.81 (ref 0.41–5.90)

## 2020-07-04 NOTE — Telephone Encounter (Signed)
Medication Refill - Medication: ALPRAZolam (XANAX) 0.5 MG tablet Pt has appt scheduled in August and will run out of meds before her appt / please advise   Has the patient contacted their pharmacy? No. (Agent: If no, request that the patient contact the pharmacy for the refill.) (Agent: If yes, when and what did the pharmacy advise?)  Preferred Pharmacy (with phone number or street name): [id number] - [place], [address]  [place], [place] [address]  Phone:  [phone] Fax:  [phone]   Agent: Please be advised that RX refills may take up to 3 business days. We ask that you follow-up with your pharmacy.

## 2020-07-04 NOTE — Telephone Encounter (Signed)
Last sent on 06/14/20.  It is too early for a refill.

## 2020-08-01 ENCOUNTER — Telehealth: Payer: Self-pay | Admitting: Internal Medicine

## 2020-08-01 NOTE — Telephone Encounter (Signed)
Left message for patient to call back and schedule Medicare Annual Wellness Visit (AWV) either virtually/audio only or in office. Whichever the patients preference is.  Last AWV 04/23/16; please schedule at anytime with [facility] Health Advisor.  This should be a 40 minute visit.

## 2020-08-08 ENCOUNTER — Other Ambulatory Visit: Payer: Self-pay | Admitting: Internal Medicine

## 2020-08-08 NOTE — Telephone Encounter (Signed)
Please Advise last office visit 10/14/2019. Next appt is 08/12/2020.  KP

## 2020-08-08 NOTE — Telephone Encounter (Signed)
Requested medication (s) are due for refill today: yes  Requested medication (s) are on the active medication list: {yes  Last refill: 06/14/20  #30  0 refills  Future visit scheduled yes  08/12/20  Notes to clinic: not delegated  Requested Prescriptions  Pending Prescriptions Disp Refills   ALPRAZolam (XANAX) 0.5 MG tablet [Pharmacy Med Name: ALPRAZOLAM 0.5 MG TABLET] 30 tablet 0    Sig: TAKE 1 TABLET BY MOUTH EVERYDAY AT BEDTIME      Not Delegated - Psychiatry:  Anxiolytics/Hypnotics Failed - 08/08/2020 12:16 PM      Failed - This refill cannot be delegated      Failed - Urine Drug Screen completed in last 360 days.      Failed - Valid encounter within last 6 months    Recent Outpatient Visits           9 months ago Post-infectious hypothyroidism   [facility] Glean Hess, MD   2 years ago Post-infectious hypothyroidism   [facility] Glean Hess, MD   2 years ago Acute non-recurrent pansinusitis   [facility] Glean Hess, MD   3 years ago Post-infectious hypothyroidism   [facility] Glean Hess, MD   3 years ago Congestion of nasal sinus   [facility] Glean Hess, MD       Future Appointments             In 4 days Glean Hess, MD [facility], [facility]

## 2020-08-10 ENCOUNTER — Other Ambulatory Visit: Payer: Self-pay | Admitting: Internal Medicine

## 2020-08-10 DIAGNOSIS — Z6826 Body mass index (BMI) 26.0-26.9, adult: Secondary | ICD-10-CM | POA: Diagnosis not present

## 2020-08-10 DIAGNOSIS — I5022 Chronic systolic (congestive) heart failure: Secondary | ICD-10-CM | POA: Diagnosis not present

## 2020-08-10 DIAGNOSIS — I428 Other cardiomyopathies: Secondary | ICD-10-CM | POA: Diagnosis not present

## 2020-08-10 DIAGNOSIS — I509 Heart failure, unspecified: Secondary | ICD-10-CM | POA: Diagnosis not present

## 2020-08-10 DIAGNOSIS — I110 Hypertensive heart disease with heart failure: Secondary | ICD-10-CM | POA: Diagnosis not present

## 2020-08-10 LAB — BASIC METABOLIC PANEL
BUN: 18 (ref 4–21)
CO2: 23 — AB (ref 13–22)
Chloride: 108 (ref 99–108)
Creatinine: 0.7 (ref 0.5–1.1)
Potassium: 4.2 (ref 3.4–5.3)
Sodium: 138 (ref 137–147)

## 2020-08-10 LAB — CBC AND DIFFERENTIAL
HCT: 40 (ref 36–46)
Hemoglobin: 13.8 (ref 12.0–16.0)
Platelets: 265 (ref 150–399)
WBC: 7.5

## 2020-08-10 LAB — COMPREHENSIVE METABOLIC PANEL: GFR calc non Af Amer: 85

## 2020-08-10 LAB — HEPATIC FUNCTION PANEL
ALT: 18 (ref 7–35)
AST: 17 (ref 13–35)

## 2020-08-10 NOTE — Telephone Encounter (Signed)
Requested medication (s) are due for refill today: yes  Requested medication (s) are on the active medication list: yes  Last refill:  06/14/20 #30 0 refills   Future visit scheduled: yes in 2 days  Notes to clinic:  not delegated per protocol     Requested Prescriptions  Pending Prescriptions Disp Refills   ALPRAZolam (XANAX) 0.5 MG tablet [Pharmacy Med Name: ALPRAZOLAM 0.5 MG TABLET] 30 tablet 0    Sig: TAKE 1 TABLET BY MOUTH EVERYDAY AT BEDTIME      Not Delegated - Psychiatry:  Anxiolytics/Hypnotics Failed - 08/10/2020  5:17 PM      Failed - This refill cannot be delegated      Failed - Urine Drug Screen completed in last 360 days.      Failed - Valid encounter within last 6 months    Recent Outpatient Visits           10 months ago Post-infectious hypothyroidism   [facility] Glean Hess, MD   2 years ago Post-infectious hypothyroidism   [facility] Glean Hess, MD   2 years ago Acute non-recurrent pansinusitis   [facility] Glean Hess, MD   3 years ago Post-infectious hypothyroidism   [facility] Glean Hess, MD   3 years ago Congestion of nasal sinus   [facility] Glean Hess, MD       Future Appointments             In 2 days Glean Hess, MD [facility], [facility]

## 2020-08-11 NOTE — Telephone Encounter (Signed)
Please Advise. Last office visit 10/13/2020.

## 2020-08-12 ENCOUNTER — Ambulatory Visit (INDEPENDENT_AMBULATORY_CARE_PROVIDER_SITE_OTHER): Payer: Medicare Other | Admitting: Internal Medicine

## 2020-08-12 ENCOUNTER — Encounter: Payer: Self-pay | Admitting: Internal Medicine

## 2020-08-12 ENCOUNTER — Other Ambulatory Visit: Payer: Self-pay

## 2020-08-12 VITALS — BP 114/70 | HR 67 | Ht 70.0 in | Wt 182.0 lb

## 2020-08-12 DIAGNOSIS — I5022 Chronic systolic (congestive) heart failure: Secondary | ICD-10-CM

## 2020-08-12 DIAGNOSIS — F5101 Primary insomnia: Secondary | ICD-10-CM | POA: Diagnosis not present

## 2020-08-12 DIAGNOSIS — N951 Menopausal and female climacteric states: Secondary | ICD-10-CM | POA: Diagnosis not present

## 2020-08-12 DIAGNOSIS — E785 Hyperlipidemia, unspecified: Secondary | ICD-10-CM | POA: Diagnosis not present

## 2020-08-12 DIAGNOSIS — E033 Postinfectious hypothyroidism: Secondary | ICD-10-CM | POA: Diagnosis not present

## 2020-08-12 MED ORDER — ALPRAZOLAM 0.5 MG PO TABS: 0.2500 mg | ORAL_TABLET | Freq: Every evening | ORAL | 2 refills | Status: DC | PRN

## 2020-08-12 MED ORDER — TRETINOIN 0.1 % EX CREA
TOPICAL_CREAM | Freq: Every day | CUTANEOUS | 3 refills | Status: DC
Start: 2020-08-12 — End: 2021-06-22

## 2020-08-12 NOTE — Progress Notes (Signed)
Date:  08/12/2020   Name:  Debbie Hubbard   DOB:  25-Aug-1946   MRN:  [id number]   Chief Complaint: Hypothyroidism (Follow up and Med Refills. ) and Anxiety  Thyroid Problem Presents for follow-up visit. Symptoms include anxiety. Patient reports no constipation, depressed mood, fatigue, palpitations or weight loss. The symptoms have been stable (recent TSH normal.).  Anxiety Presents for follow-up visit. Symptoms include insomnia and nervous/anxious behavior. Patient reports no chest pain, depressed mood, dizziness, excessive worry, palpitations, restlessness or shortness of breath. Symptoms occur most days. The severity of symptoms is moderate.   Compliance with medications: taking low dose alprazolam at HS as needed.   Chronic systolic heart failure Recent Cardiology OV 08/10/20: Assessment/Plan:  Impression: 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 She has been on goal directed therapy and has had moderate improvement in LVEF thus far from 15% to 40-45%. Labs are stable again today - without potassium replacement. She is exercising daily for 30 min on variety of terrains without difficulty. Weights are stable at home. Home SBP 120s/ 60s. However, BP remain significantly elevated during clinic visits - "anxiety". No med changes.  Will order Echo for next visit to review LVEF.   Lab Results  Component Value Date   CREATININE 0.7 08/10/2020   BUN 18 08/10/2020   NA 138 08/10/2020   K 4.2 08/10/2020   CL 108 08/10/2020   CO2 23 (A) 08/10/2020   Lab Results  Component Value Date   CHOL 178 06/30/2020   HDL 51 06/30/2020   LDLCALC 97 06/30/2020   TRIG 175 (A) 06/30/2020   CHOLHDL 3.1 10/14/2019   Lab Results  Component Value Date   TSH 0.81 06/30/2020   Lab Results  Component Value Date   HGBA1C 5.5 06/30/2020   Lab Results    Component Value Date   WBC 7.5 08/10/2020   HGB 13.8 08/10/2020   HCT 40 08/10/2020   MCV 96 10/14/2019   PLT 265 08/10/2020   Lab Results  Component Value Date   ALT 18 08/10/2020   AST 17 08/10/2020   ALKPHOS 56 10/14/2019   BILITOT 0.4 10/14/2019     Review of Systems  Constitutional: Negative for chills, fatigue, fever and weight loss.  HENT: Negative for trouble swallowing.   Respiratory: Negative for chest tightness and shortness of breath.   Cardiovascular: Negative for chest pain and palpitations.  Gastrointestinal: Negative for constipation.  Neurological: Negative for dizziness, light-headedness and headaches.  Psychiatric/Behavioral: Positive for sleep disturbance. Negative for dysphoric mood. The patient is nervous/anxious and has insomnia.     Patient Active Problem List   Diagnosis Date Noted  . Chronic systolic heart failure ([place]) 08/08/2018  . Low blood magnesium 05/28/2017  . LBBB (left bundle branch block) 04/25/2017  . Hx-TIA (transient ischemic attack) 07/15/2015  . 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  . Hyperlipidemia, mild 07/15/2015  . Hot flash, menopausal 07/15/2015  . 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  . Venous insufficiency of leg 07/15/2015    Allergies  Allergen Reactions  . Bee Venom Swelling and Hives    Past Surgical History:  Procedure Laterality Date  . BREAST ENHANCEMENT SURGERY  2004  . EXTRACORPOREAL SHOCK WAVE LITHOTRIPSY  03/2018  . LEFT HEART CATH AND CORONARY ANGIOGRAPHY  04/25/2017  . THYROIDECTOMY, PARTIAL  1998   [facility]  Social History   Tobacco Use  . Smoking status: Never Smoker  . Smokeless tobacco: Never Used  Vaping Use  . Vaping Use: Never used  Substance Use Topics  . Alcohol use: No    Alcohol/week: 0.0 standard drinks  . Drug use: No     Medication list has been reviewed and updated.  Current Meds  Medication Sig  . ALPRAZolam (XANAX) 0.5 MG tablet TAKE 1 TABLET BY MOUTH EVERYDAY AT  BEDTIME  . carvedilol (COREG) 12.5 MG tablet Take 1.5 tablets by mouth 2 (two) times daily.  Marland Kitchen lisinopril (ZESTRIL) 20 MG tablet Take 20 mg by mouth 2 (two) times daily.  . potassium chloride (K-DUR,KLOR-CON) 10 MEQ tablet Take by mouth.  Marland Kitchen PREMARIN vaginal cream PLACE VAGINALLY DAILY.  Marland Kitchen PREMPHASE 0.625-5 MG TABS tablet TAKE 1 TABLET BY MOUTH EVERY DAY  . RETIN-A 0.1 % cream APPLY TO AFFECTED AREA EVERY DAY AT BEDTIME  . SYNTHROID 150 MCG tablet TAKE 1 TABLET BY MOUTH EVERY DAY  . vitamin B-12 (CYANOCOBALAMIN) 1000 MCG tablet Take by mouth.    PHQ 2/9 Scores 08/12/2020 10/14/2019 08/08/2018 05/28/2017  PHQ - 2 Score 0 0 0 0  PHQ- 9 Score 0 0 - -    GAD 7 : Generalized Anxiety Score 08/12/2020  Nervous, Anxious, on Edge 0  Control/stop worrying 0  Worry too much - different things 0  Trouble relaxing 0  Restless 0  Easily annoyed or irritable 0  Afraid - awful might happen 0  Total GAD 7 Score 0  Anxiety Difficulty Not difficult at all    BP Readings from Last 3 Encounters:  08/12/20 114/70  10/14/19 124/80  08/08/18 118/64    Physical Exam Vitals and nursing note reviewed.  Constitutional:      General: She is not in acute distress.    Appearance: Normal appearance. She is well-developed.  HENT:     Head: Normocephalic and atraumatic.  Cardiovascular:     Rate and Rhythm: Normal rate and regular rhythm.     Pulses: Normal pulses.     Heart sounds: No murmur heard.   Pulmonary:     Effort: Pulmonary effort is normal. No respiratory distress.     Breath sounds: No wheezing or rhonchi.  Musculoskeletal:        General: Normal range of motion.     Cervical back: Normal range of motion and neck supple.     Right lower leg: No edema.     Left lower leg: No edema.  Skin:    General: Skin is warm and dry.     Capillary Refill: Capillary refill takes less than 2 seconds.     Findings: No rash.  Neurological:     General: No focal deficit present.     Mental Status: She  is alert and oriented to person, place, and time.  Psychiatric:        Mood and Affect: Mood normal.        Behavior: Behavior normal.     Wt Readings from Last 3 Encounters:  08/12/20 182 lb (82.6 kg)  10/14/19 163 lb (73.9 kg)  08/08/18 168 lb (76.2 kg)    BP 114/70   Pulse 67   Ht 5\' 10"  (1.778 m)   Wt 182 lb (82.6 kg)   LMP  (LMP Unknown)   SpO2 97%   BMI 26.11 kg/m   Assessment and Plan: 1. Post-infectious hypothyroidism Currently well supplemented with recent normal TSH Continue current dose  of synthroid daily Some recent weight gain noted - now working with diet and exercise  2. Hot flash, menopausal On Premphase daily Recommend stopping due to age but pt has severe sweats and hot flashes and accepts the risk to improve her quality of life  3. Hyperlipidemia, mild Recent lipid panel done by cardiology Not on statin therapy Recent LDL 97 and previous cath showed clean coronary arteries  4. Chronic systolic heart failure ([facility]) Improved EF per cardiology note; with good exercise tolerance and controlled BP  5. Primary insomnia Continue xanax 0.25 mg qhs - ALPRAZolam (XANAX) 0.5 MG tablet; Take 0.5 tablets (0.25 mg total) by mouth at bedtime as needed for anxiety.  Dispense: 30 tablet; Refill: 2   Partially dictated using Editor, commissioning. Any errors are unintentional.  Halina Maidens, MD [facility]  08/12/2020

## 2020-10-11 ENCOUNTER — Other Ambulatory Visit: Payer: Self-pay | Admitting: Internal Medicine

## 2020-10-11 DIAGNOSIS — E033 Postinfectious hypothyroidism: Secondary | ICD-10-CM

## 2020-11-22 ENCOUNTER — Telehealth: Payer: Self-pay | Admitting: Internal Medicine

## 2020-11-22 NOTE — Telephone Encounter (Signed)
Per chart review, 08/12/20 - order was dc'd  "reorder". Dose was "0.5 mg - take 1 tablet every day at bedtime"  Dose was changed to take half tablet or 0.25 mg on 08/12/20.  Pt stated that she is running out of med because she is taking the whole tablet of 0.5 mg rather than 0.25 mg or 1/2 tablet.  Per pt, she has been xanax for "20 years."- She is about to run out of prescription.  Routing back to office to review.

## 2020-11-22 NOTE — Telephone Encounter (Signed)
Pt called in to request a refill for ALPRAZolam (XANAX) 0.5 MG tablet. Pt says that she is suppose to take 1 tablet  And her Rx is written for half a tablet. Pt says that she was suppose to receive a year supply, pt says that she is out of her medication and would like a refill.     [facility]  [id number] [address] [place], [address] and [address] Phone:  [phone]  Fax:  [phone]

## 2020-11-23 ENCOUNTER — Other Ambulatory Visit: Payer: Self-pay | Admitting: Internal Medicine

## 2020-11-23 DIAGNOSIS — F5101 Primary insomnia: Secondary | ICD-10-CM

## 2020-11-23 MED ORDER — ALPRAZOLAM 0.5 MG PO TABS: 0.5000 mg | ORAL_TABLET | Freq: Every evening | ORAL | 5 refills | Status: DC | PRN

## 2020-11-23 NOTE — Telephone Encounter (Signed)
Rx sent 

## 2021-02-21 ENCOUNTER — Telehealth: Payer: Self-pay | Admitting: Internal Medicine

## 2021-02-21 NOTE — Telephone Encounter (Signed)
Left message for patient to call back and schedule Medicare Annual Wellness Visit (AWV) either virtually or in office. Whichever the patients preference is.  Last AWV 04/23/16; please schedule at anytime with [facility] Health Advisor.  This should be a 40 minute visit.

## 2021-05-25 ENCOUNTER — Other Ambulatory Visit: Payer: Self-pay | Admitting: Internal Medicine

## 2021-05-25 DIAGNOSIS — F5101 Primary insomnia: Secondary | ICD-10-CM

## 2021-06-19 ENCOUNTER — Other Ambulatory Visit: Payer: Self-pay | Admitting: Internal Medicine

## 2021-06-19 DIAGNOSIS — F5101 Primary insomnia: Secondary | ICD-10-CM

## 2021-06-20 NOTE — Telephone Encounter (Signed)
Requested medication (s) are due for refill today:  yes  Requested medication (s) are on the active medication list: yes   Last refill:  04/13/2021  Future visit scheduled: yes  Notes to clinic:  this refill cannot be delegated    Requested Prescriptions  Pending Prescriptions Disp Refills   ALPRAZolam (XANAX) 0.5 MG tablet [Pharmacy Med Name: ALPRAZOLAM 0.5 MG TABLET] 30 tablet     Sig: TAKE 1 TABLET BY MOUTH AT BEDTIME AS NEEDED FOR ANXIETY.      Not Delegated - Psychiatry:  Anxiolytics/Hypnotics Failed - 06/19/2021  3:02 PM      Failed - This refill cannot be delegated      Failed - Urine Drug Screen completed in last 360 days      Failed - Valid encounter within last 6 months    Recent Outpatient Visits           10 months ago Post-infectious hypothyroidism   [facility] Glean Hess, MD   1 year ago Post-infectious hypothyroidism   [facility] Glean Hess, MD   2 years ago Post-infectious hypothyroidism   [facility] Glean Hess, MD   3 years ago Acute non-recurrent pansinusitis   [facility] Glean Hess, MD   4 years ago Post-infectious hypothyroidism   [facility] Glean Hess, MD       Future Appointments             In 2 days Glean Hess, MD [facility], [facility]

## 2021-06-22 ENCOUNTER — Ambulatory Visit (INDEPENDENT_AMBULATORY_CARE_PROVIDER_SITE_OTHER): Payer: Medicare Other | Admitting: Internal Medicine

## 2021-06-22 ENCOUNTER — Encounter: Payer: Self-pay | Admitting: Internal Medicine

## 2021-06-22 ENCOUNTER — Other Ambulatory Visit: Payer: Self-pay

## 2021-06-22 VITALS — BP 130/74 | HR 80 | Ht 70.0 in | Wt 190.0 lb

## 2021-06-22 DIAGNOSIS — F5101 Primary insomnia: Secondary | ICD-10-CM

## 2021-06-22 DIAGNOSIS — I5022 Chronic systolic (congestive) heart failure: Secondary | ICD-10-CM | POA: Diagnosis not present

## 2021-06-22 DIAGNOSIS — G4700 Insomnia, unspecified: Secondary | ICD-10-CM | POA: Insufficient documentation

## 2021-06-22 DIAGNOSIS — Z1231 Encounter for screening mammogram for malignant neoplasm of breast: Secondary | ICD-10-CM | POA: Diagnosis not present

## 2021-06-22 DIAGNOSIS — E033 Postinfectious hypothyroidism: Secondary | ICD-10-CM | POA: Diagnosis not present

## 2021-06-22 MED ORDER — ALPRAZOLAM 0.5 MG PO TABS: 0.5000 mg | ORAL_TABLET | Freq: Every evening | ORAL | 5 refills | Status: DC | PRN

## 2021-06-22 MED ORDER — LEVOTHYROXINE SODIUM 150 MCG PO TABS
150.0000 ug | ORAL_TABLET | Freq: Every day | ORAL | 3 refills | Status: DC
Start: 2021-06-22 — End: 2022-06-13

## 2021-06-22 MED ORDER — RETIN-A 0.1 % EX CREA: TOPICAL_CREAM | Freq: Every day | CUTANEOUS | 3 refills | Status: DC

## 2021-06-22 NOTE — Progress Notes (Signed)
Date:  06/22/2021   Name:  Debbie Hubbard   DOB:  [birth year]   MRN:  [id number]   Chief Complaint: Insomnia and Hypothyroidism  Insomnia Primary symptoms: sleep disturbance, difficulty falling asleep, frequent awakening.   The problem occurs nightly. The problem is unchanged. Past treatments include medication. The treatment provided significant relief. Typical bedtime:  10-11 P.M..  How long after going to bed to you fall asleep: less than 15 minutes.    Thyroid Problem Presents for follow-up visit. Patient reports no anxiety, constipation, fatigue or palpitations. The symptoms have been stable.  CAD/CHF - she is feeling great, exercising regularly. Recently seen by Cardiology.  Started on entresto.  BMP was normal.  HRT - she is still taking Premphase and also getting testosterone pellet implants every 3-4 months.  She has good energy levels and normal appetite.  She also has skin concerns and is interested in a new version of Retin-A that she heard about.  Lab Results  Component Value Date   CREATININE 0.7 08/10/2020   BUN 18 08/10/2020   NA 138 08/10/2020   K 4.2 08/10/2020   CL 108 08/10/2020   CO2 23 (A) 08/10/2020   Lab Results  Component Value Date   CHOL 178 06/30/2020   HDL 51 06/30/2020   LDLCALC 97 06/30/2020   TRIG 175 (A) 06/30/2020   CHOLHDL 3.1 10/14/2019   Lab Results  Component Value Date   TSH 0.81 06/30/2020   Lab Results  Component Value Date   HGBA1C 5.5 06/30/2020   Lab Results  Component Value Date   WBC 7.5 08/10/2020   HGB 13.8 08/10/2020   HCT 40 08/10/2020   MCV 96 10/14/2019   PLT 265 08/10/2020   Lab Results  Component Value Date   ALT 18 08/10/2020   AST 17 08/10/2020   ALKPHOS 56 10/14/2019   BILITOT 0.4 10/14/2019     Review of Systems  Constitutional:  Negative for chills, fatigue and fever.  Respiratory:  Negative for cough, chest tightness, shortness of breath and wheezing.   Cardiovascular:  Negative for chest pain,  palpitations and leg swelling.  Gastrointestinal:  Negative for abdominal pain and constipation.  Genitourinary:  Negative for frequency, hematuria and urgency.  Musculoskeletal:  Negative for arthralgias, gait problem and joint swelling.  Skin:  Negative for rash.  Neurological:  Negative for dizziness and headaches.  Psychiatric/Behavioral:  Positive for sleep disturbance. Negative for dysphoric mood. The patient has insomnia. The patient is not nervous/anxious.    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  Current Meds  Medication Sig   ALPRAZolam (XANAX) 0.5 MG tablet Take 0.5 mg by mouth at bedtime as needed for anxiety.   carvedilol (COREG) 12.5 MG tablet Take 1.5 tablets by mouth 2 (two) times daily.   ENTRESTO 49-51 MG Take 1 tablet by mouth 2 (two) times daily.   potassium chloride (K-DUR,KLOR-CON) 10 MEQ tablet Take by mouth.   PREMARIN vaginal cream PLACE VAGINALLY DAILY.   PREMPHASE 0.625-5 MG TABS tablet TAKE 1 TABLET BY MOUTH  EVERY DAY   SYNTHROID 150 MCG tablet TAKE 1 TABLET BY MOUTH EVERY DAY   tretinoin (RETIN-A) 0.1 % cream Apply topically at bedtime.   vitamin B-12 (CYANOCOBALAMIN) 1000 MCG tablet Take by mouth.   [DISCONTINUED] lisinopril (ZESTRIL) 20 MG tablet Take 20 mg by mouth 2 (two) times daily.    PHQ 2/9 Scores 06/22/2021 08/12/2020 10/14/2019 08/08/2018  PHQ - 2 Score 0 0 0 0  PHQ- 9 Score 0 0 0 -    GAD 7 : Generalized Anxiety Score 06/22/2021 08/12/2020  Nervous, Anxious, on Edge 0 0  Control/stop worrying 0 0  Worry too much - different things 0 0  Trouble relaxing 0 0  Restless 0 0  Easily annoyed or irritable 0 0  Afraid - awful might happen 0 0  Total GAD 7 Score 0 0  Anxiety Difficulty Not difficult at all Not difficult at all    BP Readings from Last 3 Encounters:  06/22/21 130/74  08/12/20 114/70  10/14/19 124/80    Physical Exam Vitals and nursing note reviewed.  Constitutional:      General: She is not in acute distress.    Appearance: She is well-developed.  HENT:     Head: Normocephalic and atraumatic.  Neck:     Vascular: No carotid bruit.  Cardiovascular:     Rate and Rhythm: Normal rate and regular rhythm.     Pulses: Normal pulses.     Heart sounds: No murmur heard. Pulmonary:     Effort: Pulmonary effort is normal. No respiratory distress.     Breath sounds: No wheezing or rhonchi.  Musculoskeletal:     Cervical back: Normal range of motion.     Right lower leg: No edema.     Left lower leg: No edema.  Lymphadenopathy:     Cervical: No cervical adenopathy.  Skin:    General: Skin is warm and dry.     Capillary Refill: Capillary refill takes less than 2 seconds.     Findings: No rash.  Neurological:     General: No focal deficit present.     Mental Status: She is alert and oriented to person, place, and time.  Psychiatric:        Mood and Affect: Mood normal.        Behavior: Behavior normal.    Wt Readings from Last 3 Encounters:  06/22/21 190 lb  (86.2 kg)  08/12/20 182 lb (82.6 kg)  10/14/19 163 lb (73.9 kg)    BP 130/74 (BP Location: Right Arm, Patient Position: Sitting, Cuff Size: Normal)   Pulse 80   Ht 5\' 10"  (1.778 m)   Wt 190 lb (86.2 kg)   LMP  (LMP Unknown)   SpO2 97%   BMI 27.26 kg/m   Assessment and Plan: 1. Post-infectious hypothyroidism Supplemented - continue medication and check labs - TSH + free  T4 - levothyroxine (SYNTHROID) 150 MCG tablet; Take 1 tablet (150 mcg total) by mouth daily.  Dispense: 90 tablet; Refill: 3 - Hepatic function panel  2. Chronic systolic heart failure ([place]) Doing well; will check additional labs Continue Coreg and Entresto and Cardiology follow up - Lipid panel - CBC with Differential/Platelet  3. Primary insomnia - ALPRAZolam (XANAX) 0.5 MG tablet; Take 1 tablet (0.5 mg total) by mouth at bedtime as needed for anxiety.  Dispense: 30 tablet; Refill: 5  4. Encounter for screening mammogram for breast cancer She is overdue for mammogram Discussed the importance of regular screening since she is on HRT which can carry increased risk   Partially dictated using Editor, commissioning. Any errors are unintentional.  Halina Maidens, MD [facility]  06/22/2021

## 2021-06-23 LAB — TSH+FREE T4
Free T4: 1.73 ng/dL (ref 0.82–1.77)
TSH: 2.65 u[IU]/mL (ref 0.450–4.500)

## 2021-06-23 LAB — HEPATIC FUNCTION PANEL
ALT: 15 IU/L (ref 0–32)
AST: 19 IU/L (ref 0–40)
Albumin: 4.3 g/dL (ref 3.7–4.7)
Alkaline Phosphatase: 62 IU/L (ref 44–121)
Bilirubin Total: 0.3 mg/dL (ref 0.0–1.2)
Bilirubin, Direct: <0.1 mg/dL (ref 0.00–0.40)
Total Protein: 7.1 g/dL (ref 6.0–8.5)

## 2021-06-23 LAB — CBC WITH DIFFERENTIAL/PLATELET
Basophils Absolute: 0.1 10*3/uL (ref 0.0–0.2)
Basos: 1 %
EOS (ABSOLUTE): 0.2 10*3/uL (ref 0.0–0.4)
Eos: 2 %
Hematocrit: 43.2 % (ref 34.0–46.6)
Hemoglobin: 14.9 g/dL (ref 11.1–15.9)
Immature Grans (Abs): 0 10*3/uL (ref 0.0–0.1)
Immature Granulocytes: 0 %
Lymphocytes Absolute: 1.9 10*3/uL (ref 0.7–3.1)
Lymphs: 20 %
MCH: 32.7 pg (ref 26.6–33.0)
MCHC: 34.5 g/dL (ref 31.5–35.7)
MCV: 95 fL (ref 79–97)
Monocytes Absolute: 0.6 10*3/uL (ref 0.1–0.9)
Monocytes: 7 %
Neutrophils Absolute: 6.5 10*3/uL (ref 1.4–7.0)
Neutrophils: 70 %
Platelets: 277 10*3/uL (ref 150–450)
RBC: 4.55 x10E6/uL (ref 3.77–5.28)
RDW: 11.8 % (ref 11.7–15.4)
WBC: 9.4 10*3/uL (ref 3.4–10.8)

## 2021-06-23 LAB — LIPID PANEL
Chol/HDL Ratio: 4.2 ratio (ref 0.0–4.4)
Cholesterol, Total: 197 mg/dL (ref 100–199)
HDL: 47 mg/dL (ref >39)
LDL Chol Calc (NIH): 94 mg/dL (ref 0–99)
Triglycerides: 338 mg/dL — ABNORMAL HIGH (ref 0–149)
VLDL Cholesterol Cal: 56 mg/dL — ABNORMAL HIGH (ref 5–40)

## 2021-06-26 ENCOUNTER — Telehealth: Payer: Self-pay

## 2021-06-26 ENCOUNTER — Telehealth (INDEPENDENT_AMBULATORY_CARE_PROVIDER_SITE_OTHER): Payer: Medicare Other | Admitting: Internal Medicine

## 2021-06-26 ENCOUNTER — Encounter: Payer: Self-pay | Admitting: Internal Medicine

## 2021-06-26 ENCOUNTER — Ambulatory Visit: Payer: Self-pay | Admitting: *Deleted

## 2021-06-26 VITALS — BP 118/62 | HR 82 | Temp 97.2°F

## 2021-06-26 DIAGNOSIS — J069 Acute upper respiratory infection, unspecified: Secondary | ICD-10-CM | POA: Diagnosis not present

## 2021-06-26 DIAGNOSIS — R197 Diarrhea, unspecified: Secondary | ICD-10-CM

## 2021-06-26 DIAGNOSIS — R031 Nonspecific low blood-pressure reading: Secondary | ICD-10-CM | POA: Diagnosis not present

## 2021-06-26 NOTE — Telephone Encounter (Signed)
Noted   Pt was able to log on VV correctly.  KP

## 2021-06-26 NOTE — Telephone Encounter (Signed)
Noted  KP 

## 2021-06-26 NOTE — Telephone Encounter (Signed)

## 2021-06-26 NOTE — Telephone Encounter (Signed)
Copied from [place] [phone] [phone]. Topic: General - Other >> Jun 26, 2021  4:12 PM Pawlus, Apolonio Schneiders wrote: Reason for CRM: Pt stated [phone] is the best number to reach her on for her virtual visit. Please advise.

## 2021-06-26 NOTE — Progress Notes (Signed)
Date:  06/26/2021   Name:  Debbie Hubbard   DOB:  19-Apr-1946   MRN:  [id number]  This encounter was conducted via video encounter due to the need for social distancing in light of the Covid-19 pandemic.  The patient was correctly identified.  I advised that I am conducting the visit from a secure room in my office at [facility] clinic.  The patient is located at home. The limitations of this form of encounter were discussed with the patient and he/she agreed to proceed.  Some vital signs will be absent.  Chief Complaint: Sore Throat (X4 days, stomach pain, diarrhea, no cough, no fever no covid test, mucous yellow in color )  Sore Throat  This is a new problem. The current episode started in the past 7 days. The problem has been resolved. Neither side of throat is experiencing more pain than the other. There has been no fever. The pain is mild. Associated symptoms include diarrhea. Pertinent negatives include no coughing, headaches, shortness of breath or vomiting.  Diarrhea  This is a new problem. The current episode started today. The problem occurs 2 to 4 times per day (now resolved). The stool consistency is described as Watery. Pertinent negatives include no chills, coughing, fever, headaches or vomiting.  Low BP - this AM BP was 88 systolic.  Cardiology nurse told her to hold meds for one day.  Recent BP back up.  Lab Results  Component Value Date   CREATININE 0.7 08/10/2020   BUN 18 08/10/2020   NA 138 08/10/2020   K 4.2 08/10/2020   CL 108 08/10/2020   CO2 23 (A) 08/10/2020   Lab Results  Component Value Date   CHOL 197 06/22/2021   HDL 47 06/22/2021   LDLCALC 94 06/22/2021   TRIG 338 (H) 06/22/2021   CHOLHDL 4.2 06/22/2021   Lab Results  Component Value Date   TSH 2.650 06/22/2021   Lab Results  Component Value Date   HGBA1C 5.5 06/30/2020   Lab Results  Component Value Date   WBC 9.4 06/22/2021   HGB 14.9 06/22/2021   HCT 43.2 06/22/2021   MCV 95  06/22/2021   PLT 277 06/22/2021   Lab Results  Component Value Date   ALT 15 06/22/2021   AST 19 06/22/2021   ALKPHOS 62 06/22/2021   BILITOT 0.3 06/22/2021     Review of Systems  Constitutional:  Negative for chills, fatigue and fever.  HENT:  Negative for sore throat.   Respiratory:  Negative for cough, chest tightness and shortness of breath.   Cardiovascular:  Negative for chest pain, palpitations and leg swelling.  Gastrointestinal:  Positive for diarrhea. Negative for vomiting.  Neurological:  Negative for dizziness and headaches.   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  Current Meds  Medication Sig   ALPRAZolam (XANAX) 0.5 MG tablet Take 1 tablet (0.5 mg total) by mouth at bedtime as needed for anxiety.   carvedilol (COREG) 12.5 MG tablet Take 1.5 tablets by mouth 2 (two) times daily.   ENTRESTO 49-51 MG Take 1 tablet by mouth 2 (two) times daily.   levothyroxine (SYNTHROID) 150 MCG tablet  Take 1 tablet (150 mcg total) by mouth daily.   NON FORMULARY Testosterone pellet implants   potassium chloride (K-DUR,KLOR-CON) 10 MEQ tablet Take by mouth.   PREMARIN vaginal cream PLACE VAGINALLY DAILY.   PREMPHASE 0.625-5 MG TABS tablet TAKE 1 TABLET BY MOUTH EVERY DAY   RETIN-A 0.1 % cream Apply topically at bedtime.   vitamin B-12 (CYANOCOBALAMIN) 1000 MCG tablet Take by mouth.    PHQ 2/9 Scores 06/26/2021 06/22/2021 08/12/2020 10/14/2019  PHQ - 2 Score 0 0 0 0  PHQ- 9 Score 0 0 0 0    GAD 7 : Generalized Anxiety Score 06/26/2021 06/22/2021 08/12/2020  Nervous, Anxious, on Edge 0 0 0  Control/stop worrying 0 0 0  Worry too much - different things 0 0 0  Trouble relaxing 0 0 0  Restless 0 0 0  Easily annoyed or irritable 0 0 0  Afraid - awful might happen 0 0 0  Total GAD 7 Score 0 0 0  Anxiety Difficulty - Not difficult at all Not difficult at all    BP Readings from Last 3 Encounters:  06/26/21 118/62  06/22/21 130/74  08/12/20 114/70    Physical Exam Constitutional:      Appearance: She is not ill-appearing.  Pulmonary:     Effort: Pulmonary effort is normal.  Neurological:     General: No focal deficit present.     Mental Status: She is alert and oriented to person, place, and time.    Wt Readings from Last 3 Encounters:  06/22/21 190 lb (86.2 kg)  08/12/20 182 lb (82.6 kg)  10/14/19 163 lb (73.9 kg)    BP 118/62   Pulse 82   Temp (!) 97.2 F (36.2 C)   LMP  (LMP Unknown)   Assessment and Plan: 1. Viral URI With sore throat and no other symptoms; now resolved Supportive care with rest, fluids  2. Diarrhea, unspecified type Single episode without worrisome features Recommend bland diet, fluids  3. Low blood pressure reading Hold meds for one day then resume Monitor BP and HR at home  I spent 12 minutes on this encounter. Partially dictated using Editor, commissioning. Any errors are unintentional.  Halina Maidens, MD [facility]  06/26/2021

## 2021-06-26 NOTE — Telephone Encounter (Signed)
Patient is calling to report she was in the office last Thursday- patient  states she started with a sore throat the next day. Patient has nausea and stomach pain over the weekend and last night she got dizzy when she went to bathroom.Patient reports her BP was very low- 88/42 and 90/42 early this morning. Patient called her cardiologist and was advised to not take BP medication and call PCP. Patient BP has increased to 108/53- and she is resting still. Patient does not have transportation to the office and has a virtual visit at [place] patient I would send mote for possible earlier appointment. Patient advised she may be asked to do COVID test - she is going to see if one of her friends can bring one to her.

## 2021-06-26 NOTE — Telephone Encounter (Signed)
Reason for Disposition  Diastolic BP < 50 mm Hg  Answer Assessment - Initial Assessment Questions 1. BLOOD PRESSURE: "What is the blood pressure?" "Did you take at least two measurements 5 minutes apart?"     90/42 P 74,  108/53 P 79 2. ONSET: "When did you take your blood pressure?"     10:00 3. HOW: "How did you obtain the blood pressure?" (e.g., visiting nurse, automatic home BP monitor)     Automatic cuff- wrist 4. HISTORY: "Do you have a history of high blood pressure?"     yes 5. MEDICATIONS: "Are you taking any medications for blood pressure?" "Have you missed any doses recently?"     Yes- did not take this morning as directed by cardiologist 6. OTHER SYMPTOMS: "Do you have any symptoms?" (e.g., headache, chest pain, blurred vision, difficulty breathing, weakness)     Felt faint , blurred vision- early this morning 7. PREGNANCY: "Is there any chance you are pregnant?" "When was your last menstrual period?"     N/a  Protocols used: Blood Pressure - High-A-AH, Blood Pressure - Low-A-AH

## 2021-07-13 ENCOUNTER — Other Ambulatory Visit: Payer: Self-pay | Admitting: Internal Medicine

## 2021-07-13 NOTE — Telephone Encounter (Signed)
Requested medication (s) are due for refill today: no  Requested medication (s) are on the active medication list: yes   Last refill:  05/07/2021  Future visit scheduled: no  Notes to clinic:  Failed protocol  Mammogram is up-to-date per Health Maintenance   Requested Prescriptions  Pending Prescriptions Disp Refills   PREMARIN vaginal cream [Pharmacy Med Name: PREMARIN VAGINAL CREAM-APPL] 30 g 5    Sig: PLACE VAGINALLY DAILY.      OB/GYN:  Estrogens Failed - 07/13/2021 11:39 AM      Failed - Mammogram is up-to-date per Health Maintenance      Passed - Last BP in normal range    BP Readings from Last 1 Encounters:  06/26/21 118/62          Passed - Valid encounter within last 12 months    Recent Outpatient Visits           2 weeks ago Viral URI   [facility] Glean [facility], [facility]   3 weeks ago Post-infectious hypothyroidism   [facility] Glean Hess, [facility]   11 months ago Post-infectious hypothyroidism   [facility] Glean Hess, MD   1 year ago Post-infectious hypothyroidism   [facility] Glean Hess, [facility]   2 years ago Post-infectious hypothyroidism   [facility] Glean Hess, [facility]

## 2021-07-14 ENCOUNTER — Other Ambulatory Visit: Payer: Self-pay | Admitting: Internal Medicine

## 2021-07-14 NOTE — Telephone Encounter (Signed)
  Notes to clinic:  [place].   Requested Prescriptions  Pending Prescriptions Disp Refills   PREMARIN vaginal cream [Pharmacy Med Name: PREMARIN VAGINAL CREAM-APPL] 30 g 5    Sig: PLACE VAGINALLY DAILY.      OB/GYN:  Estrogens Failed - 07/14/2021 10:47 AM      Failed - Mammogram is up-to-date per Health Maintenance      Passed - Last BP in normal range    BP Readings from Last 1 Encounters:  06/26/21 118/62          Passed - Valid encounter within last 12 months    Recent Outpatient Visits           2 weeks ago Viral URI   [facility] Glean Hess, [facility]   3 weeks ago Post-infectious hypothyroidism   [facility] Glean Hess, MD   11 months ago Post-infectious hypothyroidism   [facility] Glean Hess, MD   1 year ago Post-infectious hypothyroidism   [facility] Glean Hess, MD   2 years ago Post-infectious hypothyroidism   [facility] Glean Hess, MD

## 2021-09-08 ENCOUNTER — Telehealth: Payer: Self-pay

## 2021-09-08 NOTE — Progress Notes (Signed)
Left message for patient to call back and schedule Medicare Annual Wellness Visit (AWV)     Last AWV 04/23/2016  Please schedule with [facility] Health Advisor.       60 Minutes appointment    Any questions, please call me at  Noreene Larsson, [address], [place], [place] [address] Direct Dial: [phone] [email or web address] Website: [email or web address]

## 2021-09-16 HISTORY — PX: LAPAROSCOPIC CHOLECYSTECTOMY: SUR755

## 2021-10-12 ENCOUNTER — Other Ambulatory Visit: Payer: Self-pay | Admitting: Internal Medicine

## 2021-10-12 NOTE — Telephone Encounter (Signed)
Failed protocol of mammogram, requesting too soon.  Requested Prescriptions  Pending Prescriptions Disp Refills  . PREMPHASE 0.625-5 MG TABS tablet [Pharmacy Med Name: PREMPHASE 0.625-5 MG TABLET] 84 tablet 4    Sig: TAKE 1 TABLET BY MOUTH EVERY DAY     OB/GYN:  Hormone Combinations Failed - 10/12/2021 11:08 AM      Failed - Mammogram is up-to-date per Health Maintenance      Passed - Last BP in normal range    BP Readings from Last 1 Encounters:  06/26/21 118/62         Passed - Valid encounter within last 12 months    Recent Outpatient Visits          3 months ago Viral URI   [facility] Glean Hess, MD   3 months ago Post-infectious hypothyroidism   [facility] Glean Hess, MD   1 year ago Post-infectious hypothyroidism   [facility] Glean Hess, MD   1 year ago Post-infectious hypothyroidism   [facility] Glean Hess, MD   3 years ago Post-infectious hypothyroidism   [facility] Glean Hess, MD

## 2021-11-24 ENCOUNTER — Other Ambulatory Visit: Payer: Self-pay | Admitting: Internal Medicine

## 2021-11-24 NOTE — Telephone Encounter (Signed)
Requested medication (s) are due for refill today: yes  Requested medication (s) are on the active medication list: yes  Last refill:  10/11/20 #84/4RF  Future visit scheduled: No  Notes to clinic:  mammogram is not up to date. Please advise.     Requested Prescriptions  Pending Prescriptions Disp Refills   PREMPHASE 0.625-5 MG TABS tablet [Pharmacy Med Name: PREMPHASE 0.625-5 MG TABLET] 84 tablet 4    Sig: TAKE 1 TABLET BY MOUTH EVERY DAY     OB/GYN:  Hormone Combinations Failed - 11/24/2021 12:42 PM      Failed - Mammogram is up-to-date per Health Maintenance      Passed - Last BP in normal range    BP Readings from Last 1 Encounters:  06/26/21 118/62          Passed - Valid encounter within last 12 months    Recent Outpatient Visits           5 months ago Viral URI   [facility] Glean Hess, MD   5 months ago Post-infectious hypothyroidism   [facility] Gle[facility] Hess, [facility]   1 year ago Post-infectious hypothyroidism   [facility]an Hess, [facility]   2 years ago Post-infectious hypothyroidism   [facility] Glean Hess, MD   3 years ago Post-infectious hypothyroidism   [facility] Glean Hess, MD

## 2021-12-20 ENCOUNTER — Other Ambulatory Visit: Payer: Self-pay | Admitting: Internal Medicine

## 2021-12-20 DIAGNOSIS — F5101 Primary insomnia: Secondary | ICD-10-CM

## 2021-12-21 NOTE — Telephone Encounter (Signed)
Please review. Last office visit 06/22/2021.  KP

## 2021-12-21 NOTE — Telephone Encounter (Signed)
Requested medication (s) are due for refill today: yes  Requested medication (s) are on the active medication list: no  Last refill:  11/25/21  Future visit scheduled: no  Notes to clinic:  Unable to refill per protocol, cannot delegate. Unable to refill per protocol due to failed labs, no updated results.      Requested Prescriptions  Pending Prescriptions Disp Refills   ALPRAZolam (XANAX) 0.5 MG tablet [Pharmacy Med Name: ALPRAZOLAM 0.5 MG TABLET] 30 tablet     Sig: TAKE 1 TABLET BY MOUTH AT BEDTIME AS NEEDED FOR ANXIETY.     Not Delegated - Psychiatry:  Anxiolytics/Hypnotics Failed - 12/20/2021  5:25 PM      Failed - This refill cannot be delegated      Failed - Urine Drug Screen completed in last 360 days      Failed - Valid encounter within last 6 months    Recent Outpatient Visits           5 months ago Viral URI   [facility] Glean Hess, MD   6 months ago Post-infectious hypothyroidism   [facility] Glean Hess, MD   1 year ago Post-infectious hypothyroidism   [facility] Glean Hess, MD   2 years ago Post-infectious hypothyroidism   [facility] Glean Hess, MD   3 years ago Post-infectious hypothyroidism   [facility] Glean Hess, MD

## 2021-12-27 ENCOUNTER — Telehealth: Payer: Self-pay | Admitting: Internal Medicine

## 2021-12-27 NOTE — Telephone Encounter (Signed)
Copied from [place] [id number]. Topic: Medicare AWV >> Dec 27, 2021  9:12 AM Cher Nakai R wrote: Reason for CRM:  Left message for patient to call back and schedule Medicare Annual Wellness Visit (AWV) in office.   If unable to come into the office for AWV,  please offer to do virtually or by telephone.  Last AWV: 04/23/2016  Please schedule at anytime with [facility] Health Advisor.      40 Minutes appointment   Any questions, please call me at [phone]

## 2022-01-19 ENCOUNTER — Other Ambulatory Visit: Payer: Self-pay | Admitting: Internal Medicine

## 2022-01-19 DIAGNOSIS — F5101 Primary insomnia: Secondary | ICD-10-CM

## 2022-01-19 NOTE — Telephone Encounter (Signed)
Requested medication (s) are due for refill today: yes  Requested medication (s) are on the active medication list: yes  Last refill:  12/22/21 for Xanax, 11/24/21 for Premphase  Future visit scheduled: No  Notes to clinic:  Unable to refill per protocol, cannot delegate.      Requested Prescriptions  Pending Prescriptions Disp Refills   PREMPHASE 0.625-5 MG TABS tablet [Pharmacy Med Name: PREMPHASE 0.625-5 MG TABLET] 84 tablet 0    Sig: TAKE 1 TABLET BY MOUTH EVERY DAY     OB/GYN:  Hormone Combinations Failed - 01/19/2022  4:48 PM      Failed - Mammogram is up-to-date per Health Maintenance      Passed - Last BP in normal range    BP Readings from Last 1 Encounters:  06/26/21 118/62          Passed - Valid encounter within last 12 months    Recent Outpatient Visits           6 months ago Viral URI   [facility] Glean Hess, MD   7 months ago Post-infectious hypothyroidism   [facility] Gle[facility] Hess, [facility]   1 year ago Post-infectious hypothyroidism   [facility] Gle[facility] Hess, [facility]   2 years ago Post-infectious hypothyroidism   [facility] Glean Hess, MD   3 years ago Post-infectious hypothyroidism   [facility] Glean Hess, MD               ALPRAZolam Duanne Moron) 0.5 MG tablet [Pharmacy Med Name: ALPRAZOLAM 0.5 MG TABLET] 30 tablet 0    Sig: TAKE 1 TABLET BY MOUTH AT BEDTIME AS NEEDED FOR ANXIETY.     Not Delegated - Psychiatry: Anxiolytics/Hypnotics 2 Failed - 01/19/2022  4:48 PM      Failed - This refill cannot be delegated      Failed - Urine Drug Screen completed in last 360 days      Failed - Valid encounter within last 6 months    Recent Outpatient Visits           6 months ago Viral URI   [facility] Glean Hess, MD   7 months ago Post-infectious hypothyroidism   [facility] Glean Hess, MD   1 year ago Post-infectious hypothyroidism   [facility] Glean Hess, MD   2 years ago Post-infectious hypothyroidism   [facility] Glean Hess, MD   3 years ago Post-infectious hypothyroidism   [facility] Glean Hess, MD              Passed - Patient is not pregnant

## 2022-01-22 NOTE — Telephone Encounter (Signed)
Please review. Last office visit 06/22/2021.  KP

## 2022-01-31 ENCOUNTER — Other Ambulatory Visit: Payer: Self-pay | Admitting: Internal Medicine

## 2022-01-31 DIAGNOSIS — E033 Postinfectious hypothyroidism: Secondary | ICD-10-CM

## 2022-01-31 NOTE — Telephone Encounter (Signed)
Refilled 06/22/2021 #90 3 refills - 1 year supply. Requested Prescriptions  Pending Prescriptions Disp Refills   SYNTHROID 150 MCG tablet [Pharmacy Med Name: SYNTHROID 150 MCG TABLET] 90 tablet 3    Sig: TAKE 1 TABLET BY MOUTH EVERY DAY     Endocrinology:  Hypothyroid Agents Passed - 01/31/2022  2:41 PM      Passed - TSH in normal range and within 360 days    TSH  Date Value Ref Range Status  06/22/2021 2.650 0.450 - 4.500 uIU/mL Final         Passed - Valid encounter within last 12 months    Recent Outpatient Visits          7 months ago Viral URI   [facility] Glean Hess, MD   7 months ago Post-infectious hypothyroidism   [facility] Glean Hess, MD   1 year ago Post-infectious hypothyroidism   [facility] Glean Hess, MD   2 years ago Post-infectious hypothyroidism   [facility] Glean Hess, MD   3 years ago Post-infectious hypothyroidism   [facility] Glean Hess, MD

## 2022-03-05 ENCOUNTER — Other Ambulatory Visit: Payer: Self-pay | Admitting: Internal Medicine

## 2022-03-07 NOTE — Telephone Encounter (Signed)
Requested medication (s) are due for refill today:   yes ? ?Requested medication (s) are on the active medication list:   Yes ? ?Future visit scheduled:   Yes in 1 wk ? ? ?Last ordered: 06/22/2021 60 g, 3 refills ? ?Returned because a new Rx is needed  ? ?Requested Prescriptions  ?Pending Prescriptions Disp Refills  ? RETIN-A 0.1 % cream [Pharmacy Med Name: RETIN-A 0.1% CREAM] 60 g 3  ?  Sig: APPLY TO AFFECTED AREA EVERY DAY AT BEDTIME  ?  ? Dermatology:  Acne preparations Passed - 03/05/2022 11:27 AM  ?  ?  Passed - Valid encounter within last 12 months  ?  Recent Outpatient Visits   ? ?      ? 8 months ago Viral URI  ? [facility] Glean Hess, MD  ? 8 months ago Post-infectious hypothyroidism  ? [facility] Glean Hess, MD  ? 1 year ago Post-infectious hypothyroidism  ? [facility] Glean Hess, MD  ? 2 years ago Post-infectious hypothyroidism  ? [facility] Glean Hess, MD  ? 3 years ago Post-infectious hypothyroidism  ? [facility] Glean Hess, MD  ? ?  ?  ?Future Appointments   ? ?        ? In 1 week Glean Hess, MD [facility]  ? ?  ? ?  ?  ?  ? ?

## 2022-03-19 ENCOUNTER — Ambulatory Visit: Payer: Medicare Other | Admitting: Internal Medicine

## 2022-03-23 ENCOUNTER — Ambulatory Visit: Payer: Medicare Other | Admitting: Internal Medicine

## 2022-05-08 ENCOUNTER — Other Ambulatory Visit: Payer: Self-pay | Admitting: Internal Medicine

## 2022-05-09 NOTE — Telephone Encounter (Signed)
Requested medication (s) are due for refill today: Yes  Requested medication (s) are on the active medication list: Yes  Last refill:  01/22/22  Future visit scheduled: No  Notes to clinic:  Protocol indicates mammogram is not up to date.    Requested Prescriptions  Pending Prescriptions Disp Refills   PREMPHASE 0.625-5 MG TABS tablet [Pharmacy Med Name: PREMPHASE 0.625-5 MG TABLET] 84 tablet 0    Sig: TAKE 1 TABLET BY MOUTH EVERY DAY     OB/GYN:  Hormone Combinations Failed - 05/08/2022  1:31 AM      Failed - Mammogram is up-to-date per Health Maintenance      Passed - Last BP in normal range    BP Readings from Last 1 Encounters:  06/26/21 118/62         Passed - Valid encounter within last 12 months    Recent Outpatient Visits           10 months ago Viral URI   [facility] Glean Hess, MD   10 months ago Post-infectious hypothyroidism   [facility] Glean Hess, MD   1 year ago Post-infectious hypothyroidism   [facility] Glean Hess, MD   2 years ago Post-infectious hypothyroidism   [facility] Glean Hess, MD   3 years ago Post-infectious hypothyroidism   [facility] Glean Hess, MD

## 2022-05-29 ENCOUNTER — Ambulatory Visit: Payer: Medicare Other | Admitting: Internal Medicine

## 2022-06-13 ENCOUNTER — Ambulatory Visit (INDEPENDENT_AMBULATORY_CARE_PROVIDER_SITE_OTHER): Payer: Medicare Other | Admitting: Internal Medicine

## 2022-06-13 ENCOUNTER — Ambulatory Visit (INDEPENDENT_AMBULATORY_CARE_PROVIDER_SITE_OTHER): Payer: Medicare Other

## 2022-06-13 ENCOUNTER — Encounter: Payer: Self-pay | Admitting: Internal Medicine

## 2022-06-13 VITALS — BP 136/76 | HR 76 | Ht 70.0 in | Wt 189.0 lb

## 2022-06-13 VITALS — BP 136/74 | HR 76 | Ht 70.0 in | Wt 189.0 lb

## 2022-06-13 DIAGNOSIS — F5101 Primary insomnia: Secondary | ICD-10-CM

## 2022-06-13 DIAGNOSIS — Z0000 Encounter for general adult medical examination without abnormal findings: Secondary | ICD-10-CM

## 2022-06-13 DIAGNOSIS — E033 Postinfectious hypothyroidism: Secondary | ICD-10-CM

## 2022-06-13 DIAGNOSIS — N951 Menopausal and female climacteric states: Secondary | ICD-10-CM

## 2022-06-13 DIAGNOSIS — I5022 Chronic systolic (congestive) heart failure: Secondary | ICD-10-CM

## 2022-06-13 LAB — BASIC METABOLIC PANEL
BUN: 18 (ref 4–21)
CO2: 24 — AB (ref 13–22)
Chloride: 101 (ref 99–108)
Creatinine: 0.9 (ref 0.5–1.1)
Glucose: 95
Potassium: 4.9 mEq/L (ref 3.5–5.1)
Sodium: 138 (ref 137–147)

## 2022-06-13 LAB — HEPATIC FUNCTION PANEL
ALT: 21 U/L (ref 7–35)
AST: 17 (ref 13–35)
Alkaline Phosphatase: 84 (ref 25–125)
Bilirubin, Total: 0.4

## 2022-06-13 LAB — LIPID PANEL
Cholesterol: 197 (ref 0–200)
HDL: 52 (ref 35–70)
LDL Cholesterol: 104
Triglycerides: 243 — AB (ref 40–160)

## 2022-06-13 LAB — VITAMIN D 25 HYDROXY (VIT D DEFICIENCY, FRACTURES): Vit D, 25-Hydroxy: 108

## 2022-06-13 LAB — VITAMIN B12: Vitamin B-12: 1989

## 2022-06-13 LAB — CBC AND DIFFERENTIAL
HCT: 44 (ref 36–46)
Hemoglobin: 15 (ref 12.0–16.0)
Neutrophils Absolute: 5.6
Platelets: 291 10*3/uL (ref 150–400)
WBC: 8.1

## 2022-06-13 LAB — HEMOGLOBIN A1C: Hemoglobin A1C: 5.7

## 2022-06-13 LAB — TSH: TSH: 1.74 (ref 0.41–5.90)

## 2022-06-13 LAB — COMPREHENSIVE METABOLIC PANEL
Albumin: 4.2 (ref 3.5–5.0)
Calcium: 10 (ref 8.7–10.7)
eGFR: 69

## 2022-06-13 LAB — CBC: RBC: 4.56 (ref 3.87–5.11)

## 2022-06-13 MED ORDER — LEVOTHYROXINE SODIUM 150 MCG PO TABS: 150.0000 ug | ORAL_TABLET | Freq: Every day | ORAL | 3 refills | Status: DC

## 2022-06-13 MED ORDER — ALPRAZOLAM 0.5 MG PO TABS: ORAL_TABLET | ORAL | 0 refills | Status: DC

## 2022-06-13 MED ORDER — PREMARIN 0.625 MG/GM VA CREA: TOPICAL_CREAM | VAGINAL | 5 refills | Status: DC

## 2022-06-13 MED ORDER — PREMPHASE 0.625-5 MG PO TABS: 1.0000 | ORAL_TABLET | Freq: Every day | ORAL | 1 refills | Status: DC

## 2022-06-13 NOTE — Progress Notes (Signed)
Subjective:   Debbie Hubbard is a 76 y.o. female who presents for an Initial Medicare Annual Wellness Visit.  I connected with  Debbie Hubbard on 06/13/22 by a  in person  office visit and verified that I am speaking with the correct person using two identifiers.  Patient Location: Other:  OFFICE  Provider Location: Office/Clinic  I discussed the limitations of evaluation and management by telemedicine. The patient expressed understanding and agreed to proceed.   Review of Systems    Defer to PCP       Objective:    Today's Vitals   06/13/22 1122 06/13/22 1123  BP: 136/76   Pulse: 76   SpO2: 96%   Weight: 189 lb (85.7 kg)   Height: '5\' 10"'$  (1.778 m)   PainSc:  0-No pain   Body mass index is 27.12 kg/m.     04/23/2016   10:43 AM  Advanced Directives  Does Patient Have a Medical Advance Directive? Yes  Type of Paramedic of [place];Living will    Current Medications (verified) Outpatient Encounter Medications as of 06/13/2022  Medication Sig   ALPRAZolam (XANAX) 0.5 MG tablet TAKE 1 TABLET BY MOUTH AT BEDTIME AS NEEDED FOR ANXIETY   carvedilol (COREG) 12.5 MG tablet Take 1.5 tablets by mouth 2 (two) times daily.   ENTRESTO 49-51 MG Take 1 tablet by mouth 2 (two) times daily.   levothyroxine (SYNTHROID) 150 MCG tablet Take 1 tablet (150 mcg total) by mouth daily.   NON FORMULARY Testosterone pellet implants   PREMARIN vaginal cream PLACE VAGINALLY DAILY.   PREMPHASE 0.625-5 MG TABS tablet TAKE 1 TABLET BY MOUTH EVERY DAY   RETIN-A 0.1 % cream APPLY TO AFFECTED AREA EVERY DAY AT BEDTIME   vitamin B-12 (CYANOCOBALAMIN) 1000 MCG tablet Take by mouth.   potassium chloride (K-DUR,KLOR-CON) 10 MEQ tablet Take by mouth.   No facility-administered encounter medications on file as of 06/13/2022.    Allergies (verified) Bee venom   History: Past Medical History:  Diagnosis Date   NSTEMI (non-ST elevated myocardial infarction) ([place])  04/25/2017   Past Surgical History:  Procedure Laterality Date   BREAST ENHANCEMENT SURGERY  2004   EXTRACORPOREAL SHOCK WAVE LITHOTRIPSY  03/2018   LEFT HEART CATH AND CORONARY ANGIOGRAPHY  04/25/2017   THYROIDECTOMY, PARTIAL  1998   [facility]   Family History  Family history unknown: Yes   Social History   Socioeconomic History   Marital status: Unknown    Spouse name: Not on file   Number of children: Not on file   Years of education: Not on file   Highest education level: Not on file  Occupational History   Not on file  Tobacco Use   Smoking status: Never   Smokeless tobacco: Never  Vaping Use   Vaping Use: Never used  Substance and Sexual Activity   Alcohol use: No    Alcohol/week: 0.0 standard drinks of alcohol   Drug use: No   Sexual activity: Not on file  Other Topics Concern   Not on file  Social History Narrative   Not on file   Social Determinants of Health   Financial Resource Strain: Low Risk  (06/13/2022)   Overall Financial Resource Strain (CARDIA)    Difficulty of Paying Living Expenses: Not hard at all  Food Insecurity: No Food Insecurity (06/13/2022)   Hunger Vital Sign    Worried About Running Out of Food in the Last Year: Never true    Ran  Out of Food in the Last Year: Never true  Transportation Needs: No Transportation Needs (06/13/2022)   PRAPARE - Hydrologist (Medical): No    Lack of Transportation (Non-Medical): No  Physical Activity: Sufficiently Active (06/13/2022)   Exercise Vital Sign    Days of Exercise per Week: 5 days    Minutes of Exercise per Session: 60 min  Stress: No Stress Concern Present (06/13/2022)   [place]    Feeling of Stress : Not at all  Social Connections: Unknown (06/13/2022)   Social Connection and Isolation Panel [NHANES]    Frequency of Communication with Friends and Family: More than three times a week    Frequency of  Social Gatherings with Friends and Family: Three times a week    Attends Religious Services: 1 to 4 times per year    Active Member of Clubs or Organizations: Yes    Attends Archivist Meetings: 1 to 4 times per year    Marital Status: Not on file    Tobacco Counseling Not needed. Not a smoker.   Clinical Intake:  Pre-visit preparation completed: Yes  Pain : No/denies pain Pain Score: 0-No pain     BMI - recorded: 27.12 Nutritional Status: BMI 25 -29 Overweight Nutritional Risks: None Diabetes: No     Diabetic? NO  Interpreter Needed?: No  Information entered by :: Fianna Snowball, [place] (AAMA)   Activities of Daily Living    06/13/2022   11:07 AM 06/26/2021    4:20 PM  In your present state of health, do you have any difficulty performing the following activities:  Hearing? 0 0  Vision? 0 0  Difficulty concentrating or making decisions? 0 0  Walking or climbing stairs? 0 0  Dressing or bathing? 0 0  Doing errands, shopping? 0 0    Patient Care Team: Glean Hess, MD as PCP - General (Internal Medicine) [place] (Cardiology) Carroll Sage, MD as Referring Physician (Urology) [facility] Dom (Alternative Medicine)  Indicate any recent Medical Services you may have received from other than [facility] providers in the past year (date may be approximate).     Assessment:   This is a routine wellness examination for Debbie Hubbard.  Hearing/Vision screen No concerns for vision or hearing at this time.  Dietary issues and exercise activities discussed:     Goals Addressed             This Visit's Progress    Stay Active and Independent-Low Back Pain        Regular activity or exercise is important to managing back pain.  Activity helps to keep your muscles strong.  You will sleep better and feel more relaxed.  You will have more energy and feel less stressed.  If you are not active now, start slowly. Little changes make a big difference.   Rest, but not too much.  Stay as active as you can and listen to your body's signals.        Depression Screen    06/13/2022   11:07 AM 06/26/2021    4:19 PM 06/22/2021   10:42 AM 08/12/2020    1:51 PM 10/14/2019   10:57 AM 08/08/2018    1:39 PM 05/28/2017    9:56 AM  PHQ 2/9 Scores  PHQ - 2 Score 6 0 0 0 0 0 0  PHQ- 9 Score 6 0 0 0 0  Fall Risk    06/13/2022   11:07 AM 06/26/2021    4:20 PM 06/22/2021   10:42 AM 08/12/2020    1:51 PM 11/10/2019    4:55 PM  Fall Risk   Falls in the past year? 0 0 0 0 0  Comment     Emmi Telephone Survey: data to providers prior to load  Number falls in past yr: 0  0 0   Injury with Fall? 0  0 0   Risk for fall due to : No Fall Risks      Follow up Falls evaluation completed Falls evaluation completed Falls evaluation completed Falls evaluation completed     [place]:  Any stairs in or around the home? No  If so, are there any without handrails?  N/A Home free of loose throw rugs in walkways, pet beds, electrical cords, etc? Yes  Adequate lighting in your home to reduce risk of falls? Yes   ASSISTIVE DEVICES UTILIZED TO PREVENT FALLS:  Life alert? Yes  Use of a cane, walker or w/c? No  Grab bars in the bathroom? No  Shower chair or bench in shower? No  Elevated toilet seat or a handicapped toilet? No   TIMED UP AND GO:  Was the test performed? No .  Length of time to ambulate 10 feet: N/A sec.   Gait steady and fast without use of assistive device  Cognitive Screening:  6CIT - 0   Immunizations Immunization History  Administered Date(s) Administered   Fluad Quad(high Dose 65+) 10/14/2019   Influenza-Unspecified 10/17/2017   Pneumococcal Conjugate-13 12/28/2014   Pneumococcal Polysaccharide-23 04/23/2016    TDAP status: Due, Education has been provided regarding the importance of this vaccine. Advised may receive this vaccine at local pharmacy or Health Dept. Aware to provide a copy of  the vaccination record if obtained from local pharmacy or Health Dept. Verbalized acceptance and understanding.  Flu Vaccine status: Up to date  Pneumococcal vaccine status: Up to date  Covid-19 vaccine status: Declined, Education has been provided regarding the importance of this vaccine but patient still declined. Advised may receive this vaccine at local pharmacy or Health Dept.or vaccine clinic. Aware to provide a copy of the vaccination record if obtained from local pharmacy or Health Dept. Verbalized acceptance and understanding.  Qualifies for Shingles Vaccine? Yes   Zostavax completed No   Shingrix Completed?: No.    Education has been provided regarding the importance of this vaccine. Patient has been advised to call insurance company to determine out of pocket expense if they have not yet received this vaccine. Advised may also receive vaccine at local pharmacy or Health Dept. Verbalized acceptance and understanding.  Screening Tests Health Maintenance  Topic Date Due   COVID-19 Vaccine (1) Never done   Hepatitis C Screening  Never done   Zoster Vaccines- Shingrix (1 of 2) Never done   MAMMOGRAM  12/25/2017   TETANUS/TDAP  06/22/2022 (Originally 04/24/1965)   INFLUENZA VACCINE  07/17/2022   Pneumonia Vaccine 27+ Years old  Completed   DEXA SCAN  Completed   HPV VACCINES  Aged Out   Fecal DNA (Cologuard)  Discontinued    Health Maintenance  Health Maintenance Due  Topic Date Due   COVID-19 Vaccine (1) Never done   Hepatitis C Screening  Never done   Zoster Vaccines- Shingrix (1 of 2) Never done   MAMMOGRAM  12/25/2017    Colorectal cancer screening: No longer required.  Mammogram status: No longer required due to AGE.  Bone Density status: Completed 12/18/2010. Results reflect: Bone density results: NORMAL. Repeat every 3 years. Patient declined to further do these.  Lung Cancer Screening: (Low Dose CT Chest recommended if Age 69-80 years, 30 pack-year currently  smoking OR have quit w/in 15years.) does not qualify.   Lung Cancer Screening Referral: N/A  Additional Screening:  Hepatitis C Screening: does not qualify.  Vision Screening: Recommended annual ophthalmology exams for early detection of glaucoma and other disorders of the eye. Is the patient up to date with their annual eye exam?  Yes   Dental Screening: Recommended annual dental exams for proper oral hygiene  Community Resource Referral / Chronic Care Management: CRR required this visit?  No   CCM required this visit?  No      Plan:     I have personally reviewed and noted the following in the patient's chart:   Medical and social history Use of alcohol, tobacco or illicit drugs  Current medications and supplements including opioid prescriptions. Patient is not currently taking opioid prescriptions. Functional ability and status Nutritional status Physical activity Advanced directives List of other physicians Hospitalizations, surgeries, and ER visits in previous 12 months Vitals Screenings to include cognitive, depression, and falls Referrals and appointments  In addition, I have reviewed and discussed with patient certain preventive protocols, quality metrics, and best practice recommendations. A written personalized care plan for preventive services as well as general preventive health recommendations were provided to patient.     Clista Bernhardt, [place]   06/13/2022   Nurse Notes: FACE TO FACE 20 minute encounter.

## 2022-06-13 NOTE — Progress Notes (Signed)
Date:  06/13/2022   Name:  Debbie Hubbard   DOB:  [birth year]   MRN:  [id number]   Chief Complaint: Hypothyroidism  Thyroid Problem Presents for follow-up visit. Patient reports no constipation, diarrhea, fatigue or palpitations. The symptoms have been stable.  Hypertension This is a chronic problem. The problem is controlled. Pertinent negatives include no chest pain, headaches, palpitations or shortness of breath. Hypertensive end-organ damage includes CAD/MI (heart failure much improved). Identifiable causes of hypertension include a thyroid problem.    Lab Results  Component Value Date   NA 138 08/10/2020   K 4.2 08/10/2020   CO2 23 (A) 08/10/2020   GLUCOSE 85 10/14/2019   BUN 18 08/10/2020   CREATININE 0.7 08/10/2020   CALCIUM 9.6 10/14/2019   GFRNONAA 85 08/10/2020   Lab Results  Component Value Date   CHOL 197 06/22/2021   HDL 47 06/22/2021   LDLCALC 94 06/22/2021   TRIG 338 (H) 06/22/2021   CHOLHDL 4.2 06/22/2021   Lab Results  Component Value Date   TSH 2.650 06/22/2021   Lab Results  Component Value Date   HGBA1C 5.5 06/30/2020   Lab Results  Component Value Date   WBC 9.4 06/22/2021   HGB 14.9 06/22/2021   HCT 43.2 06/22/2021   MCV 95 06/22/2021   PLT 277 06/22/2021   Lab Results  Component Value Date   ALT 15 06/22/2021   AST 19 06/22/2021   ALKPHOS 62 06/22/2021   BILITOT 0.3 06/22/2021   No results found for: "25OHVITD2", "25OHVITD3", "VD25OH"   Review of Systems  Constitutional:  Negative for fatigue and unexpected weight change.  HENT:  Negative for nosebleeds.   Eyes:  Negative for visual disturbance.  Respiratory:  Negative for cough, chest tightness, shortness of breath and wheezing.   Cardiovascular:  Negative for chest pain, palpitations and leg swelling.  Gastrointestinal:  Negative for abdominal pain, constipation and diarrhea.  Neurological:  Negative for dizziness, weakness, light-headedness and headaches.    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   Hyperlipidemia, mild 07/15/2015   Hot flash, menopausal 07/15/2015   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   Venous insufficiency of leg 07/15/2015    Allergies  Allergen Reactions   Bee Venom Swelling and Hives    Past Surgical History:  Procedure Laterality Date   BREAST ENHANCEMENT SURGERY  2004   EXTRACORPOREAL SHOCK WAVE LITHOTRIPSY  03/2018   LEFT HEART CATH AND CORONARY ANGIOGRAPHY  04/25/2017   THYROIDECTOMY, PARTIAL  1998   [facility]    Social History   Tobacco Use   Smoking status: Never   Smokeless tobacco: Never  Vaping Use   Vaping Use: Never used  Substance Use Topics   Alcohol use: No    Alcohol/week: 0.0 standard drinks of alcohol   Drug use: No     Medication list has been reviewed and updated.  Current Meds  Medication Sig   ALPRAZolam (XANAX) 0.5 MG tablet TAKE 1 TABLET BY MOUTH AT BEDTIME AS NEEDED FOR ANXIETY   carvedilol (COREG) 12.5 MG tablet Take 1.5 tablets by mouth 2 (two) times daily.   ENTRESTO 49-51 MG Take 1 tablet by mouth 2 (two) times daily.   levothyroxine (SYNTHROID) 150 MCG tablet Take  1 tablet (150 mcg total) by mouth daily.   NON FORMULARY Testosterone pellet implants   PREMARIN vaginal cream PLACE VAGINALLY DAILY.   PREMPHASE 0.625-5 MG TABS tablet TAKE 1 TABLET BY MOUTH EVERY DAY   RETIN-A 0.1 % cream APPLY TO AFFECTED AREA EVERY DAY AT BEDTIME   vitamin B-12 (CYANOCOBALAMIN) 1000 MCG tablet Take by mouth.       06/13/2022   11:07 AM 06/26/2021    4:20 PM 06/22/2021   10:42 AM 08/12/2020    1:51 PM  GAD 7 : Generalized Anxiety Score  Nervous, Anxious, on Edge 0 0 0 0  Control/stop worrying 0 0 0 0  Worry too much - different things  0 0 0 0  Trouble relaxing 0 0 0 0  Restless 0 0 0 0  Easily annoyed or irritable 0 0 0 0  Afraid - awful might happen 0 0 0 0  Total GAD 7 Score 0 0 0 0  Anxiety Difficulty Not difficult at all  Not difficult at all Not difficult at all       06/13/2022   11:07 AM 06/26/2021    4:19 PM 06/22/2021   10:42 AM  Depression screen PHQ 2/9  Decreased Interest 3 0   Down, Depressed, Hopeless 3 0 0  PHQ - 2 Score 6 0 0  Altered sleeping 0 0 0  Tired, decreased energy 0 0 0  Change in appetite 0 0 0  Feeling bad or failure about yourself  0 0 0  Trouble concentrating 0 0 0  Moving slowly or fidgety/restless 0 0 0  Suicidal thoughts 0 0 0  PHQ-9 Score 6 0 0  Difficult doing work/chores Not difficult at all  Not difficult at all    BP Readings from Last 3 Encounters:  06/13/22 136/74  06/26/21 118/62  06/22/21 130/74    Physical Exam Vitals and nursing note reviewed.  Constitutional:      General: She is not in acute distress.    Appearance: Normal appearance. She is well-developed.  HENT:     Head: Normocephalic and atraumatic.  Neck:     Vascular: No carotid bruit.  Cardiovascular:     Rate and Rhythm: Normal rate and regular rhythm.     Heart sounds: No murmur heard. Pulmonary:     Effort: Pulmonary effort is normal. No respiratory distress.     Breath sounds: No wheezing or rhonchi.  Musculoskeletal:     Cervical back: Normal range of motion.     Right lower leg: No edema.     Left lower leg: No edema.  Lymphadenopathy:     Cervical: No cervical adenopathy.  Skin:    General: Skin is warm and dry.     Capillary Refill: Capillary refill takes less than 2 seconds.     Findings: No rash.  Neurological:     General: No focal deficit present.     Mental Status: She is alert and oriented to person, place, and time.  Psychiatric:        Mood and Affect: Mood normal.        Behavior: Behavior normal.     Wt Readings from Last 3 Encounters:  06/13/22 189 lb (85.7 kg)   06/22/21 190 lb (86.2 kg)  08/12/20 182 lb (82.6 kg)    BP 136/74   Pulse 76   Ht '5\' 10"'$  (1.778 m)   Wt 189 lb (85.7 kg)   LMP  (LMP Unknown)   SpO2  96%   BMI 27.12 kg/m   Assessment and Plan: 1. Post-infectious hypothyroidism Stable on daily thyroid replacement. She has labs ordered for TSH and T4 from her aesthetician - levothyroxine (SYNTHROID) 150 MCG tablet; Take 1 tablet (150 mcg total) by mouth daily.  Dispense: 90 tablet; Refill: 3  2. Chronic systolic heart failure (HCC) Much improved ECHO done last fall. Continue Cardiology follow up and Entresto  3. Primary insomnia Doing well on low dose Xanax No evidence of misuse - ALPRAZolam (XANAX) 0.5 MG tablet; TAKE 1 TABLET BY MOUTH AT BEDTIME AS NEEDED FOR ANXIETY  Dispense: 30 tablet; Refill: 0  4. Hot flash, menopausal She continues on Premphase daily - no vaginal bleeding She is also getting testosterone pellets from the aesthetician Extensive labs have been ordered at [facility] - patient will request that the results also be sent to me. - conjugated estrogens-medroxyprogesteron (PREMPHASE) TABS tablet; Take 1 tablet by mouth daily.  Dispense: 84 tablet; Refill: 1 - conjugated estrogens (PREMARIN) vaginal cream; PLACE VAGINALLY DAILY.  Dispense: 30 g; Refill: 5   Partially dictated using Editor, commissioning. Any errors are unintentional.  Halina Maidens, MD [facility]  06/13/2022

## 2022-06-20 ENCOUNTER — Telehealth: Payer: Self-pay | Admitting: Internal Medicine

## 2022-06-20 NOTE — Telephone Encounter (Signed)
Patient informed per Dr Army Melia:  Per Dr Army Melia:  Note  I reviewed the labs.  I will let the other MD interpret the hormone levels. Triglycerides are high - need to work on low fat diet. Mild pre-diabetes - work on low carb diet Vitamin D is high - reduce any supplements by half. B12 is high - cut any supplements by half. Ferritin is elevated (measure of iron stores) - stop any iron supplements. Other labs - blood count, renal function and liver tests are normal. --follow up for OV and repeat labs above in 4 months.

## 2022-06-20 NOTE — Telephone Encounter (Signed)
Copied from [place] [phone]. Topic: General - Other >> Jun 20, 2022  1:53 PM Tiffany B wrote: Patient wanted to inform PCP labs have been received from Nikki Dom phone # [phone]. Patient states PCP would know who this specialist is because PCP added on to the labs ordered by specialist. Patient will fax lab results to PCP at [phone], please confirm when received. Patient would like a follow up call from PCP or nurse to discuss

## 2022-06-20 NOTE — Telephone Encounter (Signed)
Patient informed.  Debbie Hubbard 

## 2022-06-20 NOTE — Telephone Encounter (Signed)
I reviewed the labs.  I will let the other MD interpret the hormone levels. Triglycerides are high - need to work on low fat diet. Mild pre-diabetes - work on low carb diet Vitamin D is high - reduce any supplements by half. B12 is high - cut any supplements by half. Ferritin is elevated (measure of iron stores) - stop any iron supplements. Other labs - blood count, renal function and liver tests are normal. --follow up for OV and repeat labs above in 4 months.

## 2022-06-20 NOTE — Telephone Encounter (Signed)
Waiting to receive labs.Marland KitchenMarland Kitchen

## 2022-07-13 ENCOUNTER — Other Ambulatory Visit: Payer: Self-pay | Admitting: Internal Medicine

## 2022-07-13 DIAGNOSIS — F5101 Primary insomnia: Secondary | ICD-10-CM

## 2022-07-13 DIAGNOSIS — E033 Postinfectious hypothyroidism: Secondary | ICD-10-CM

## 2022-07-16 NOTE — Telephone Encounter (Signed)
Requested Prescriptions  Pending Prescriptions Disp Refills  . 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 ALPRAZolam (XANAX) 0.5 MG tablet [Pharmacy Med Name: ALPRAZOLAM 0.5 MG TABLET] 30 tablet 0    Sig: TAKE 1 TABLET BY MOUTH AT BEDTIME AS NEEDED FOR ANXIETY     Not Delegated - Psychiatry: Anxiolytics/Hypnotics 2 Failed - 07/13/2022 11:46 AM      Failed - This refill cannot be delegated      Failed - Urine Drug Screen completed in last 360 days      Passed - Patient is not pregnant      Passed - Valid encounter within last 6 months    Recent Outpatient Visits          1 month ago Post-infectious hypothyroidism   [facility] Glean [facility], [facility]   1 year ago Viral URI   [facility] Glean [facility], [facility]   1 year ago Post-infectious hypothyroidism   [facility] Glean [facility], [facility]   1 year ago Post-infectious hypothyroidism   [facility] Glean [facility], [facility]    2 years ago Post-infectious hypothyroidism   [facility] Glean [facility], MD      Future Appointments            In 3 months Army Melia Jesse Sans, MD [facility], [facility]

## 2022-07-16 NOTE — Telephone Encounter (Signed)
Please advise 

## 2022-07-16 NOTE — Telephone Encounter (Signed)
Requested medications are due for refill today.  yes  Requested medications are on the active medications list.  yes  Last refill. 06/13/2022 #30 0 refills  Future visit scheduled.   yes  Notes to clinic.  Refill not delegated.    Requested Prescriptions  Pending Prescriptions Disp Refills   ALPRAZolam (XANAX) 0.5 MG tablet [Pharmacy Med Name: ALPRAZOLAM 0.5 MG TABLET] 30 tablet 0    Sig: TAKE 1 TABLET BY MOUTH AT BEDTIME AS NEEDED FOR ANXIETY     Not Delegated - Psychiatry: Anxiolytics/Hypnotics 2 Failed - 07/13/2022 11:46 AM      Failed - This refill cannot be delegated      Failed - Urine Drug Screen completed in last 360 days      Passed - Patient is not pregnant      Passed - Valid encounter within last 6 months    Recent Outpatient Visits           1 month ago Post-infectious hypothyroidism   [facility] Gle[facility], [facility]   1 year ago Viral URI   [facility]   1 year ago Post-infectious hypothyroidism   [facility] Glean [facility], [facility]   1 year ago Post-infectious hypothyroidism   [facility], [facility]   2 years ago Post-infectious hypothyroidism   [facility], [facility]       Future Appointments             In 3 months Army Melia Jesse Sans, MD [facility], [facility]            Signed Prescriptions Disp Refills   SYNTHROID 150 MCG tablet 90 tablet 3    Sig: TAKE 1 TABLET BY MOUTH EVERY DAY     Endocrinology:  Hypothyroid Agents Passed - 07/13/2022 11:46 AM      Passed - TSH in normal range and within 360 days    TSH  Date Value Ref Range Status  06/13/2022 1.74 0.41 - 5.90 Final    Comment:    t4- 1.62  06/22/2021 2.650 0.450 - 4.500 uIU/mL Final         Passed - Valid encounter within last 12 months    Recent Outpatient Visits           1 month ago Post-infectious hypothyroidism   [facility] Gle[facility], [facility]   1 year ago  Viral URI   [facility] Glean [facility], [facility]   1 year ago Post-infectious hypothyroidism   [facility] Glean [facility], [facility]   1 year ago Post-infectious hypothyroidism   [facility] Glean [facility], [facility]   2 years ago Post-infectious hypothyroidism   [facility] Glean [facility], MD       Future Appointments             In 3 months Army Melia Jesse Sans, MD [facility], [facility]

## 2022-08-17 ENCOUNTER — Other Ambulatory Visit: Payer: Self-pay | Admitting: Internal Medicine

## 2022-08-17 DIAGNOSIS — F5101 Primary insomnia: Secondary | ICD-10-CM

## 2022-08-17 NOTE — Telephone Encounter (Signed)
Requested medication (s) are due for refill today - yes  Requested medication (s) are on the active medication list -yes  Future visit scheduled -yes  Last refill: 07/17/22 #30  Notes to clinic: non delegated Rx  Requested Prescriptions  Pending Prescriptions Disp Refills   ALPRAZolam (XANAX) 0.5 MG tablet [Pharmacy Med Name: ALPRAZOLAM 0.5 MG TABLET] 30 tablet 0    Sig: TAKE 1 TABLET BY MOUTH AT BEDTIME AS NEEDED FOR ANXIETY     Not Delegated - Psychiatry: Anxiolytics/Hypnotics 2 Failed - 08/17/2022 12:11 PM      Failed - This refill cannot be delegated      Failed - Urine Drug Screen completed in last 360 days      Passed - Patient is not pregnant      Passed - Valid encounter within last 6 months    Recent Outpatient Visits           2 months ago Post-infectious hypothyroidism   [place] [facility] at [facility], Jesse Sans, MD   1 year ago Viral URI   [place] and [facility] Medicine at [facility], Jesse Sans, MD   1 year ago Post-infectious hypothyroidism   [place] [facility] at [facility], Jesse Sans, MD   2 years ago Post-infectious hypothyroidism   [place] [facility] at [facility], Jesse Sans, MD   2 years ago Post-infectious hypothyroidism   [place] [facility] at [facility], Jesse Sans, MD       Future Appointments             In 2 months Army Melia Jesse Sans, MD [facility] at [facility], [facility]               Requested Prescriptions  Pending Prescriptions Disp Refills   ALPRAZolam Duanne Moron) 0.5 MG tablet [Pharmacy Med Name: ALPRAZOLAM 0.5 MG TABLET] 30 tablet 0    Sig: TAKE 1 TABLET BY MOUTH AT BEDTIME AS NEEDED FOR ANXIETY     Not Delegated - Psychiatry: Anxiolytics/Hypnotics 2 Failed - 08/17/2022 12:11 PM      Failed - This refill cannot be  delegated      Failed - Urine Drug Screen completed in last 360 days      Passed - Patient is not pregnant      Passed - Valid encounter within last 6 months    Recent Outpatient Visits           2 months ago Post-infectious hypothyroidism   [place] [facility] at [facility], Jesse Sans, MD   1 year ago Viral URI   [place] [facility] at [facility], Jesse Sans, MD   1 year ago Post-infectious hypothyroidism   [place] [facility] at [facility], Jesse Sans, MD   2 years ago Post-infectious hypothyroidism   [place] [facility] at [facility], Jesse Sans, MD   2 years ago Post-infectious hypothyroidism   [place] [facility] at [facility], Jesse Sans, MD       Future Appointments             In 2 months Army Melia, Jesse Sans, MD [place] [facility] at [facility], [facility]

## 2022-08-17 NOTE — Telephone Encounter (Signed)
Please review. Last office visit 06/13/22.  KP

## 2022-10-22 ENCOUNTER — Ambulatory Visit: Payer: Medicare Other | Admitting: Internal Medicine

## 2022-11-12 ENCOUNTER — Other Ambulatory Visit: Payer: Self-pay | Admitting: Internal Medicine

## 2022-11-12 DIAGNOSIS — F5101 Primary insomnia: Secondary | ICD-10-CM

## 2022-11-13 NOTE — Telephone Encounter (Signed)
Please review. Last office visit 06/13/2022.  KP

## 2022-11-13 NOTE — Telephone Encounter (Signed)
Requested medication (s) are due for refill today -yes  Requested medication (s) are on the active medication list -yes  Future visit scheduled -yes  Last refill: -08/17/22 #30 1RF  Notes to clinic: non delegated Rx  Requested Prescriptions  Pending Prescriptions Disp Refills   ALPRAZolam (XANAX) 0.5 MG tablet [Pharmacy Med Name: ALPRAZOLAM 0.5 MG TABLET] 30 tablet 1    Sig: TAKE 1 TABLET BY MOUTH AT BEDTIME AS NEEDED FOR ANXIETY     Not Delegated - Psychiatry: Anxiolytics/Hypnotics 2 Failed - 11/12/2022 11:57 AM      Failed - This refill cannot be delegated      Failed - Urine Drug Screen completed in last 360 days      Passed - Patient is not pregnant      Passed - Valid encounter within last 6 months    Recent Outpatient Visits           5 months ago Post-infectious hypothyroidism   [place] [facility] at [facility], Jesse Sans, MD   1 year ago Viral URI   [place] [facility] at [facility], Jesse Sans, MD   1 year ago Post-infectious hypothyroidism   [place] [facility] at [facility], Jesse Sans, MD   2 years ago Post-infectious hypothyroidism   [place] [facility] Medicine at [facility], Jesse Sans, MD   3 years ago Post-infectious hypothyroidism   [place] [facility] and [facility] Medicine at [facility], Jesse Sans, MD       Future Appointments             In 1 month Army Melia, Jesse Sans, MD [facility] Medicine at [facility], [facility]               Requested Prescriptions  Pending Prescriptions Disp Refills   ALPRAZolam Duanne Moron) 0.5 MG tablet [Pharmacy Med Name: ALPRAZOLAM 0.5 MG TABLET] 30 tablet 1    Sig: TAKE 1 TABLET BY MOUTH AT BEDTIME AS NEEDED FOR ANXIETY     Not Delegated - Psychiatry: Anxiolytics/Hypnotics 2 Failed - 11/12/2022 11:57 AM      Failed - This refill cannot be  delegated      Failed - Urine Drug Screen completed in last 360 days      Passed - Patient is not pregnant      Passed - Valid encounter within last 6 months    Recent Outpatient Visits           5 months ago Post-infectious hypothyroidism   [place] [facility] at [facility], Jesse Sans, MD   1 year ago Viral URI   [place] [facility] at [facility], Jesse Sans, MD   1 year ago Post-infectious hypothyroidism   [place] [facility] at [facility], Jesse Sans, MD   2 years ago Post-infectious hypothyroidism   [place] [facility] and Sports Medicine at [facility], Jesse Sans, MD   3 years ago Post-infectious hypothyroidism   [place] [facility] Medicine at [facility], Jesse Sans, MD       Future Appointments             In 1 month Army Melia, Jesse Sans, MD [place] [facility] Medicine at [facility], [facility]

## 2022-11-26 ENCOUNTER — Ambulatory Visit: Payer: Medicare Other | Admitting: Internal Medicine

## 2022-12-25 ENCOUNTER — Ambulatory Visit: Payer: Self-pay | Admitting: *Deleted

## 2022-12-25 NOTE — Telephone Encounter (Signed)
  Per agent" "The patient has the flu and has been sick since [place]. She has been vomiting, lack of appetite, nausea. She doesn't know what to do but needs relief or some kind of medication. Please assist patient further".      Chief Complaint: Nausea Symptoms: Pt did not test for flu as mentioned in agents note.   Nausea, loss of appetite since 12/15/22. Some vomiting off and on unintentional weight loss, 5-10 lbs "Not eating, no appetite." Reports generalized weakness. States last vomited yesterday, "But had been a while before that." Mid abdominal pain yesterday, none presently.  Frequency: 12/15/22 Pertinent Negatives: Patient denies.. states is staying hydrated. Disposition: '[]'$ ED /'[]'$ Urgent Care (no appt availability in office) / '[x]'$ Appointment(In office/virtual)/ '[]'$  [place] [facility]/ '[]'$ Home Care/ '[]'$ Refused Recommended Disposition /'[]'$ [place] [facility]/ '[]'$  Follow-up with PCP Additional Notes: Pt requesting VV, "I'm really too  weak." Secured VV for tomorrow per pt's request/schedule.  States has MyChart acct but needs assist with PW. Number to [facility] assist provided. Care advise provided, pt verbalizes understanding.  Reason for Disposition  Nausea lasts > 1 week  Answer Assessment - Initial Assessment Questions 1. NAUSEA SEVERITY: "How bad is the nausea?" (e.g., mild, moderate, severe; dehydration, weight loss)   - MILD: loss of appetite without change in eating habits   - MODERATE: decreased oral intake without significant weight loss, dehydration, or malnutrition   - SEVERE: inadequate caloric or fluid intake, significant weight loss, symptoms of dehydration    5-10 lbs since 12/15/22 2. ONSET: "When did the nausea begin?"     12/15/22 3. VOMITING: "Any vomiting?" If Yes, ask: "How many times today?"     Off and on, just once yesterday. Abdominal pain mid, lower abd. Yesterday, not presently. "Not eating."  4. RECURRENT SYMPTOM: "Have you had nausea  before?" If Yes, ask: "When was the last time?" "What happened that time?"     Off and on since 12/15/22 5. CAUSE: "What do you think is causing the nausea?"     Unsure  Protocols used: Nausea-A-AH

## 2022-12-25 NOTE — Telephone Encounter (Signed)
Noted  KP 

## 2022-12-26 ENCOUNTER — Telehealth: Payer: Self-pay

## 2022-12-26 ENCOUNTER — Telehealth: Payer: Medicare Other | Admitting: Internal Medicine

## 2022-12-26 NOTE — Telephone Encounter (Signed)
Error.     KP 

## 2022-12-28 ENCOUNTER — Ambulatory Visit: Payer: Medicare Other | Admitting: Internal Medicine

## 2023-01-07 ENCOUNTER — Ambulatory Visit: Payer: Medicare Other | Admitting: Internal Medicine

## 2023-02-18 ENCOUNTER — Other Ambulatory Visit: Payer: Self-pay | Admitting: Internal Medicine

## 2023-02-18 DIAGNOSIS — F5101 Primary insomnia: Secondary | ICD-10-CM

## 2023-02-18 DIAGNOSIS — N951 Menopausal and female climacteric states: Secondary | ICD-10-CM

## 2023-03-27 ENCOUNTER — Other Ambulatory Visit: Payer: Self-pay | Admitting: Internal Medicine

## 2023-03-27 ENCOUNTER — Ambulatory Visit: Payer: Medicare Other | Admitting: Internal Medicine

## 2023-03-28 NOTE — Telephone Encounter (Signed)
Requested Prescriptions  Pending Prescriptions Disp Refills   tretinoin (RETIN-A) 0.1 % cream [Pharmacy Med Name: TRETINOIN 0.1% CREAM] 45 g 0    Sig: APPLY TO AFFECTED AREA EVERY DAY AT BEDTIME     Dermatology:  Acne preparations Passed - 03/27/2023 11:33 AM      Passed - Valid encounter within last 12 months    Recent Outpatient Visits           9 months ago Post-infectious hypothyroidism   [place] [facility] at [facility], Nyoka Cowden, MD   1 year ago Viral URI   [place] [facility] at [facility], Nyoka Cowden, MD   1 year ago Post-infectious hypothyroidism   [place] [facility] Medicine at [facility], Nyoka Cowden, MD   2 years ago Post-infectious hypothyroidism    Primary Care & Sports Medicine at [facility], Nyoka Cowden, MD   3 years ago Post-infectious hypothyroidism   [facility] at [facility], Nyoka Cowden, MD       Future Appointments             In 1 month Judithann Graves, Nyoka Cowden, MD [facility] at [facility], [facility]

## 2023-04-09 ENCOUNTER — Ambulatory Visit: Payer: Medicare Other | Admitting: Internal Medicine

## 2023-04-15 ENCOUNTER — Ambulatory Visit: Payer: Medicare Other | Admitting: Internal Medicine

## 2023-05-07 ENCOUNTER — Ambulatory Visit: Payer: Medicare Other | Admitting: Internal Medicine

## 2023-05-14 ENCOUNTER — Encounter: Payer: Self-pay | Admitting: Internal Medicine

## 2023-05-14 ENCOUNTER — Ambulatory Visit (INDEPENDENT_AMBULATORY_CARE_PROVIDER_SITE_OTHER): Payer: Medicare Other | Admitting: Internal Medicine

## 2023-05-14 VITALS — BP 122/68 | HR 72 | Ht 70.0 in | Wt 184.0 lb

## 2023-05-14 DIAGNOSIS — F5101 Primary insomnia: Secondary | ICD-10-CM

## 2023-05-14 DIAGNOSIS — I5022 Chronic systolic (congestive) heart failure: Secondary | ICD-10-CM | POA: Diagnosis not present

## 2023-05-14 DIAGNOSIS — N951 Menopausal and female climacteric states: Secondary | ICD-10-CM | POA: Diagnosis not present

## 2023-05-14 DIAGNOSIS — Z1159 Encounter for screening for other viral diseases: Secondary | ICD-10-CM

## 2023-05-14 DIAGNOSIS — E033 Postinfectious hypothyroidism: Secondary | ICD-10-CM | POA: Diagnosis not present

## 2023-05-14 MED ORDER — PREMPHASE 0.625-5 MG PO TABS: 1.0000 | ORAL_TABLET | Freq: Every day | ORAL | 1 refills | Status: DC

## 2023-05-14 MED ORDER — ALPRAZOLAM 0.5 MG PO TABS: 0.5000 mg | ORAL_TABLET | Freq: Every evening | ORAL | 1 refills | Status: DC | PRN

## 2023-05-14 MED ORDER — TRETINOIN 0.1 % EX CREA: TOPICAL_CREAM | CUTANEOUS | 1 refills | Status: DC

## 2023-05-14 MED ORDER — LEVOTHYROXINE SODIUM 150 MCG PO TABS: 150.0000 ug | ORAL_TABLET | Freq: Every day | ORAL | 3 refills | Status: DC

## 2023-05-14 NOTE — Assessment & Plan Note (Addendum)
She continues on Premphase for management of severe vasomotor symptoms. She is aware of possible side effects - esp DVT and breast cancer and she accepts the risks I encourage Mammograms but she declines in favor of self exams

## 2023-05-14 NOTE — Assessment & Plan Note (Signed)
Chronic symptoms relieved by Xanax 0.5 mg nightly PRN

## 2023-05-14 NOTE — Progress Notes (Signed)
Date:  05/14/2023   Name:  Debbie Hubbard   DOB:  08/08/1946   MRN:  [id number]   Chief Complaint: Hypothyroidism  Thyroid Problem Presents for follow-up visit. Patient reports no anxiety, constipation, diarrhea, fatigue, menstrual problem or palpitations. The symptoms have been stable.  Insomnia Primary symptoms: sleep disturbance, difficulty falling asleep, frequent awakening.   The problem occurs nightly. The problem is unchanged.  Menopause - she does not want to change her HRT because she is doing well.  Previous attempts to reduce the dose were not successful.  She is still getting testosterone pellets from an Aesthetician and continues Premphase. CHF - she recently was seen by Cardiology - EF has returned to normal.  She continues on Coreg and Entresto.  No chest pain or shortness of breath, has not needed any lasix.  Lab Results  Component Value Date   NA 138 06/13/2022   K 4.9 06/13/2022   CO2 24 (A) 06/13/2022   GLUCOSE 85 10/14/2019   BUN 18 06/13/2022   CREATININE 0.9 06/13/2022   CALCIUM 10.0 06/13/2022   EGFR 69 06/13/2022   GFRNONAA 85 08/10/2020   Lab Results  Component Value Date   CHOL 197 06/13/2022   HDL 52 06/13/2022   LDLCALC 104 06/13/2022   TRIG 243 (A) 06/13/2022   CHOLHDL 4.2 06/22/2021   Lab Results  Component Value Date   TSH 1.74 06/13/2022   Lab Results  Component Value Date   HGBA1C 5.7 06/13/2022   Lab Results  Component Value Date   WBC 8.1 06/13/2022   HGB 15.0 06/13/2022   HCT 44 06/13/2022   MCV 95 06/22/2021   PLT 291 06/13/2022   Lab Results  Component Value Date   ALT 21 06/13/2022   AST 17 06/13/2022   ALKPHOS 84 06/13/2022   BILITOT 0.3 06/22/2021   Lab Results  Component Value Date   VD25OH 108 06/13/2022     Review of Systems  Constitutional:  Negative for fatigue and unexpected weight change.  HENT:  Negative for nosebleeds.   Eyes:  Negative for visual disturbance.  Respiratory:  Negative for cough,  chest tightness, shortness of breath and wheezing.   Cardiovascular:  Negative for chest pain, palpitations and leg swelling.  Gastrointestinal:  Negative for abdominal pain, constipation and diarrhea.  Genitourinary:  Negative for hematuria, menstrual problem, pelvic pain and urgency.       Recently passed a kidney stone  Musculoskeletal:  Negative for arthralgias and gait problem.  Neurological:  Negative for dizziness, weakness, light-headedness and headaches.  Psychiatric/Behavioral:  Positive for sleep disturbance. Negative for dysphoric mood. The patient has insomnia. The patient is not nervous/anxious.     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   Venous insufficiency of leg 07/15/2015    Allergies  Allergen Reactions   Bee Venom Swelling and Hives    Past Surgical History:  Procedure Laterality Date   BREAST ENHANCEMENT SURGERY  2004   EXTRACORPOREAL SHOCK WAVE LITHOTRIPSY  03/2018   LAPAROSCOPIC CHOLECYSTECTOMY  09/2021   subtotal due to adhesion of the gall bladder wall to the liver   LEFT HEART  CATH AND CORONARY ANGIOGRAPHY  04/25/2017   THYROIDECTOMY, PARTIAL  1998   MNG    Social History   Tobacco Use   Smoking status: Never   Smokeless tobacco: Never  Vaping Use   Vaping Use: Never used  Substance Use Topics   Alcohol use: No    Alcohol/week: 0.0 standard drinks of alcohol   Drug use: No     Medication list has been reviewed and updated.  Current Meds  Medication Sig   carvedilol (COREG) 12.5 MG tablet Take 1.5 tablets by mouth 2 (two) times daily.   conjugated estrogens  (PREMARIN) vaginal cream PLACE VAGINALLY DAILY.   ENTRESTO 49-51 MG Take 1 tablet by mouth 2 (two) times daily.   NON FORMULARY Testosterone pellet implants   vitamin B-12 (CYANOCOBALAMIN) 1000 MCG tablet Take by mouth.   [DISCONTINUED] ALPRAZolam (XANAX) 0.5 MG tablet TAKE 1 TABLET BY MOUTH AT BEDTIME AS NEEDED FOR ANXIETY   [DISCONTINUED] PREMPHASE 0.625-5 MG TABS tablet TAKE 1 TABLET BY MOUTH EVERY DAY   [DISCONTINUED] SYNTHROID 150 MCG tablet TAKE 1 TABLET BY MOUTH EVERY DAY   [DISCONTINUED] tretinoin (RETIN-A) 0.1 % cream APPLY TO AFFECTED AREA EVERY DAY AT BEDTIME       05/14/2023   11:15 AM 06/13/2022   11:07 AM 06/26/2021    4:20 PM 06/22/2021   10:42 AM  GAD 7 : Generalized Anxiety Score  Nervous, Anxious, on Edge 0 0 0 0  Control/stop worrying 0 0 0 0  Worry too much - different things 0 0 0 0  Trouble relaxing 0 0 0 0  Restless 0 0 0 0  Easily annoyed or irritable 0 0 0 0  Afraid - awful might happen 0 0 0 0  Total GAD 7 Score 0 0 0 0  Anxiety Difficulty Not difficult at all Not difficult at all  Not difficult at all       05/14/2023   11:15 AM 06/13/2022   11:07 AM 06/26/2021    4:19 PM  Depression screen PHQ 2/9  Decreased Interest 0 3 0  Down, Depressed, Hopeless 0 3 0  PHQ - 2 Score 0 6 0  Altered sleeping 0 0 0  Tired, decreased energy 0 0 0  Change in appetite 0 0 0  Feeling bad or failure about yourself  0 0 0  Trouble concentrating 0 0 0  Moving slowly or fidgety/restless 0 0 0  Suicidal thoughts 0 0 0  PHQ-9 Score 0 6 0  Difficult doing work/chores Not difficult at all Not difficult at all     BP Readings from Last 3 Encounters:  05/14/23 122/68  06/13/22 136/76  06/13/22 136/74    Physical Exam Vitals and nursing note reviewed.  Constitutional:      General: She is not in acute distress.    Appearance: Normal appearance. She is well-developed.  HENT:     Head: Normocephalic and atraumatic.  Neck:     Vascular: No carotid bruit.   Cardiovascular:     Rate and Rhythm: Normal rate and regular rhythm.     Heart sounds: No murmur heard. Pulmonary:     Effort: Pulmonary effort is normal. No respiratory distress.     Breath sounds: No wheezing or rhonchi.  Musculoskeletal:     Cervical back: Normal range of motion.     Right lower leg: No edema.     Left lower leg: No edema.  Lymphadenopathy:     Cervical: No cervical adenopathy.  Skin:    General: Skin is warm and dry.     Capillary Refill: Capillary refill takes less than 2 seconds.     Findings: No rash.  Neurological:     General: No focal deficit present.     Mental Status: She is alert and oriented to person, place, and time.  Psychiatric:        Mood and Affect: Mood normal.        Behavior: Behavior normal.     Wt Readings from Last 3 Encounters:  05/14/23 184 lb (83.5 kg)  06/13/22 189 lb (85.7 kg)  06/13/22 189 lb (85.7 kg)    BP 122/68   Pulse 72   Ht 5\' 10"  (1.778 m)   Wt 184 lb (83.5 kg)   LMP  (LMP Unknown)   SpO2 97%   BMI 26.40 kg/m   Assessment and Plan:  Problem List Items Addressed This Visit     Post-infectious hypothyroidism (Chronic)    Stable symptoms on thyroid supplements      Relevant Medications   levothyroxine (SYNTHROID) 150 MCG tablet   Other Relevant Orders   TSH + free T4   Insomnia disorder (Chronic)    Chronic symptoms relieved by Xanax 0.5 mg nightly PRN      Relevant Medications   ALPRAZolam (XANAX) 0.5 MG tablet   Hot flash, menopausal    She continues on Premphase for management of severe vasomotor symptoms. She is aware of possible side effects - esp DVT and breast cancer and she accepts the risks I encourage Mammograms but she declines in favor of self exams       Relevant Medications   conjugated estrogens-medroxyprogesteron (PREMPHASE) TABS tablet   Chronic systolic heart failure (HCC) - Primary (Chronic)    Symptoms stable Followed by Cardiology On Coreg and Entresto      Relevant  Orders   CBC with Differential/Platelet   Comprehensive metabolic panel   Lipid panel   Other Visit Diagnoses     Need for hepatitis C screening test       Relevant Orders   Hepatitis C antibody       Return in about 6 months (around 11/14/2023) for insomnia.   Partially dictated using Dragon software, any errors are not intentional.  Reubin Milan, MD [facility] and Sports Medicine [place], [place]

## 2023-05-14 NOTE — Assessment & Plan Note (Signed)
Stable symptoms on thyroid supplements

## 2023-05-14 NOTE — Assessment & Plan Note (Addendum)
Symptoms stable Followed by Cardiology On Coreg and Entresto

## 2023-05-15 ENCOUNTER — Telehealth: Payer: Self-pay | Admitting: Internal Medicine

## 2023-05-15 NOTE — Telephone Encounter (Signed)
Copied from CRM [id number]. Topic: General - Other >> May 15, 2023  1:21 PM Debbie Hubbard wrote: Reason for CRM: The patient has called to share that they have been unable to get the bloodwork that was discussed at their recent appointment   Please contact the patient further if needed

## 2023-05-17 ENCOUNTER — Telehealth: Payer: Self-pay | Admitting: Internal Medicine

## 2023-05-17 NOTE — Telephone Encounter (Signed)
Copied from CRM [id number]. Topic: General - Other >> May 17, 2023  1:46 PM Debbie Hubbard wrote: Patient states that she was not about to get he labs done during her visit on 5/28 but plans to go to [facility] in [place] next week to have them done.

## 2023-07-10 ENCOUNTER — Telehealth: Payer: Self-pay | Admitting: Internal Medicine

## 2023-07-10 NOTE — Telephone Encounter (Signed)
Copied from CRM [id number]. Topic: Medicare AWV >> Jul 10, 2023  3:38 PM Payton Doughty wrote: Reason for CRM: LM 07/10/2023 to schedule AWV   Verlee Rossetti; Care Guide Ambulatory Clinical Support [facility] Direct Dial: [phone]

## 2023-07-18 ENCOUNTER — Other Ambulatory Visit: Payer: Self-pay | Admitting: Internal Medicine

## 2023-07-18 DIAGNOSIS — N951 Menopausal and female climacteric states: Secondary | ICD-10-CM

## 2023-07-19 NOTE — Telephone Encounter (Signed)
Requested medication (s) are due for refill today:   Yes  Requested medication (s) are on the active medication list:   Yes  Future visit scheduled:   No.   LOV 2 months ago   Last ordered: 06/13/2022 30 g, 5 refills  Returned because mammogram is due so unable to refill per protocol.   Requested Prescriptions  Pending Prescriptions Disp Refills   PREMARIN vaginal cream [Pharmacy Med Name: PREMARIN VAGINAL CREAM-APPL] 30 g 5    Sig: PLACE VAGINALLY DAILY.     OB/GYN:  Estrogens Failed - 07/18/2023  1:08 PM      Failed - Mammogram is up-to-date per Health Maintenance      Passed - Last BP in normal range    BP Readings from Last 1 Encounters:  05/14/23 122/68         Passed - Valid encounter within last 12 months    Recent Outpatient Visits           2 months ago Chronic systolic heart failure [facility])   [place] [facility] at [facility], Nyoka Cowden, MD   1 year ago Post-infectious hypothyroidism   [place] [facility] Medicine at [facility], Nyoka Cowden, MD   2 years ago Viral URI   [facility] at [facility], Nyoka Cowden, MD   2 years ago Post-infectious hypothyroidism   [facility] & Sports Medicine at [facility], Nyoka Cowden, MD   2 years ago Post-infectious hypothyroidism   [facility] Primary Care & Sports Medicine at [facility], Nyoka Cowden, MD

## 2023-08-19 ENCOUNTER — Other Ambulatory Visit: Payer: Self-pay | Admitting: Internal Medicine

## 2023-08-19 DIAGNOSIS — F5101 Primary insomnia: Secondary | ICD-10-CM

## 2023-09-22 DIAGNOSIS — N200 Calculus of kidney: Secondary | ICD-10-CM | POA: Insufficient documentation

## 2023-09-22 DIAGNOSIS — R748 Abnormal levels of other serum enzymes: Secondary | ICD-10-CM | POA: Insufficient documentation

## 2023-09-22 LAB — HEPATIC FUNCTION PANEL
ALT: 38 U/L — AB (ref 7–35)
AST: 44 — AB (ref 13–35)
Alkaline Phosphatase: 193 — AB (ref 25–125)
Bilirubin, Total: 2.1

## 2023-10-01 ENCOUNTER — Ambulatory Visit: Payer: Self-pay

## 2023-10-01 NOTE — Telephone Encounter (Signed)
Chief Complaint: Appointment  Disposition: [] ED /[] Urgent Care (no appt availability in office) / [] Appointment(In office/virtual)/ []  [place] [facility]/ [] Home Care/ [] Refused Recommended Disposition /[] [place] [facility]/ [x]  Follow-up with PCP Additional Notes: Patient called to cancel her hospital follow-up because she has an appointment at [facility] around the same time. Patient stated she will call to reschedule once she is done with her appointments at [facility].  Summary: nurse call   Pt called wanting to talk to a nurse regarding the kidney stone procedure she had.  She wants a nurse to call her around 11 am tomorrow.  She says she doesn't think she will be able to come for her HFU on Thursday.  CB#  [phone]     Reason for Disposition  Requesting regular office appointment  Answer Assessment - Initial Assessment Questions 1. REASON FOR CALL or QUESTION: "What is your reason for calling today?" or "How can I best help you?" or "What question do you have that I can help answer?"     I want to cancel my hospital follow-up with Dr. Judithann Graves I have an appointment at [facility] and I want to follow-up with Dr. Judithann Graves when my procedure is done.  Protocols used: Information Only Call - No Triage-A-AH

## 2023-10-02 NOTE — Telephone Encounter (Signed)
Called patient and left VM for her to call the office back.  - Debbie Hubbard

## 2023-10-04 ENCOUNTER — Inpatient Hospital Stay: Payer: Medicare Other | Admitting: Internal Medicine

## 2023-10-21 HISTORY — PX: OTHER SURGICAL HISTORY: SHX169

## 2023-10-22 ENCOUNTER — Ambulatory Visit: Payer: Medicare Other | Admitting: Internal Medicine

## 2023-11-08 ENCOUNTER — Ambulatory Visit: Payer: Medicare Other | Admitting: Internal Medicine

## 2023-11-17 ENCOUNTER — Other Ambulatory Visit: Payer: Self-pay | Admitting: Internal Medicine

## 2023-11-17 DIAGNOSIS — N951 Menopausal and female climacteric states: Secondary | ICD-10-CM

## 2023-11-20 LAB — CBC AND DIFFERENTIAL
HCT: 38 (ref 36–46)
Hemoglobin: 12.8 (ref 12.0–16.0)
WBC: 8.6

## 2023-11-20 LAB — BASIC METABOLIC PANEL
BUN: 30 — AB (ref 4–21)
CO2: 27 — AB (ref 13–22)
Chloride: 105 (ref 99–108)
Creatinine: 1.2 — AB (ref 0.5–1.1)
Glucose: 103
Potassium: 4.4 meq/L (ref 3.5–5.1)
Sodium: 140 (ref 137–147)

## 2023-11-20 LAB — COMPREHENSIVE METABOLIC PANEL
Calcium: 10.4 (ref 8.7–10.7)
eGFR: 48

## 2023-11-20 NOTE — Telephone Encounter (Signed)
Requested medication (s) are due for refill today: Yes  Requested medication (s) are on the active medication list: Yes  Last refill:  05/14/23  Future visit scheduled: Yes  Notes to clinic:  Protocol indicates mammogram needed.    Requested Prescriptions  Pending Prescriptions Disp Refills   PREMPHASE 0.625-5 MG TABS tablet [Pharmacy Med Name: PREMPHASE 0.625-5 MG TABLET] 84 tablet 1    Sig: TAKE 1 TABLET BY MOUTH EVERY DAY     OB/GYN:  Hormone Combinations Failed - 11/17/2023 10:55 PM      Failed - Mammogram is up-to-date per Health Maintenance      Passed - Last BP in normal range    BP Readings from Last 1 Encounters:  05/14/23 122/68         Passed - Valid encounter within last 12 months    Recent Outpatient Visits           6 months ago Chronic systolic heart failure [facility])   [place] [facility] Medicine at [facility], Nyoka Cowden, MD   1 year ago Post-infectious hypothyroidism   [place] [facility] Medicine at [facility], Nyoka Cowden, MD   2 years ago Viral URI   [facility] Primary Care & Sports Medicine at [facility], Nyoka Cowden, MD   2 years ago Post-infectious hypothyroidism   [place] [facility] Medicine at [facility], Nyoka Cowden, MD   3 years ago Post-infectious hypothyroidism   [facility] Health Primary Care & Sports Medicine at [facility], Nyoka Cowden, MD       Future Appointments             In 1 week Judithann Graves, Nyoka Cowden, MD [facility] Health Primary Care & Sports Medicine at [facility], [facility]

## 2023-11-26 ENCOUNTER — Other Ambulatory Visit: Payer: Self-pay | Admitting: Internal Medicine

## 2023-11-26 ENCOUNTER — Encounter: Payer: Self-pay | Admitting: Internal Medicine

## 2023-11-27 ENCOUNTER — Encounter: Payer: Self-pay | Admitting: Internal Medicine

## 2023-11-27 ENCOUNTER — Ambulatory Visit (INDEPENDENT_AMBULATORY_CARE_PROVIDER_SITE_OTHER): Payer: Medicare Other | Admitting: Internal Medicine

## 2023-11-27 VITALS — BP 124/64 | HR 86 | Ht 70.0 in | Wt 180.0 lb

## 2023-11-27 DIAGNOSIS — N179 Acute kidney failure, unspecified: Secondary | ICD-10-CM

## 2023-11-27 DIAGNOSIS — I5022 Chronic systolic (congestive) heart failure: Secondary | ICD-10-CM | POA: Diagnosis not present

## 2023-11-27 DIAGNOSIS — N138 Other obstructive and reflux uropathy: Secondary | ICD-10-CM | POA: Diagnosis not present

## 2023-11-27 DIAGNOSIS — R748 Abnormal levels of other serum enzymes: Secondary | ICD-10-CM

## 2023-11-27 DIAGNOSIS — E033 Postinfectious hypothyroidism: Secondary | ICD-10-CM

## 2023-11-27 DIAGNOSIS — F5101 Primary insomnia: Secondary | ICD-10-CM

## 2023-11-27 DIAGNOSIS — N200 Calculus of kidney: Secondary | ICD-10-CM | POA: Diagnosis not present

## 2023-11-27 LAB — POCT URINALYSIS DIPSTICK
Bilirubin, UA: NEGATIVE
Glucose, UA: NEGATIVE
Ketones, UA: NEGATIVE
Nitrite, UA: NEGATIVE
Protein, UA: POSITIVE — AB
Spec Grav, UA: 1.015 (ref 1.010–1.025)
Urobilinogen, UA: 0.2 U/dL
pH, UA: 5 (ref 5.0–8.0)

## 2023-11-27 MED ORDER — SYNTHROID 150 MCG PO TABS: 150.0000 ug | ORAL_TABLET | Freq: Every day | ORAL | 0 refills | Status: DC

## 2023-11-27 MED ORDER — ALPRAZOLAM 0.5 MG PO TABS: 0.5000 mg | ORAL_TABLET | Freq: Every evening | ORAL | 5 refills | Status: DC | PRN

## 2023-11-27 NOTE — Assessment & Plan Note (Signed)
Takes Xanax nightly for sleep. No evidence of misuse or diversion.

## 2023-11-27 NOTE — Assessment & Plan Note (Addendum)
Supplemented; recent T4 slightly high.  She prefers DAW.  Thyroid Stimulating Hormone (TSH) 0.34 - 5.66 IU/mL 5.04  Thyroxine, Free (FT4) 0.52 - 1.21 ng/dL 1.82 High

## 2023-11-27 NOTE — Progress Notes (Signed)
Date:  11/27/2023   Name:  Debbie Hubbard   DOB:  May 21, 1946   MRN:  [id number]   Chief Complaint: Anxiety  Urinary Tract Infection  Chronicity: recently hospitalized for urosepsis and renal stones. The current episode started more than 1 month ago. The problem has been resolved (but wants to be checked for clearance). The patient is experiencing no pain. There has been no fever. Pertinent negatives include no chills, discharge, hematuria, nausea, urgency or vomiting. She has tried increased fluids for the symptoms.  Insomnia Primary symptoms: no sleep disturbance, difficulty falling asleep.   The problem occurs nightly. Past treatments include medication. The treatment provided significant relief.  Thyroid Problem Presents for follow-up visit. Patient reports no anxiety, depressed mood, hair loss, heat intolerance, palpitations or weight gain. The symptoms have been stable.    Review of Systems  Constitutional:  Negative for chills and weight gain.  HENT:  Negative for nosebleeds.   Respiratory:  Negative for cough, chest tightness and wheezing.   Cardiovascular:  Negative for palpitations.  Gastrointestinal:  Negative for nausea and vomiting.  Endocrine: Negative for heat intolerance.  Genitourinary:  Negative for hematuria and urgency.  Neurological:  Negative for dizziness, light-headedness and headaches.  Psychiatric/Behavioral:  Negative for dysphoric mood and sleep disturbance. The patient has insomnia. The patient is not nervous/anxious.      Lab Results  Component Value Date   NA 140 11/20/2023   K 4.4 11/20/2023   CO2 27 (A) 11/20/2023   GLUCOSE 85 10/14/2019   BUN 30 (A) 11/20/2023   CREATININE 1.2 (A) 11/20/2023   CALCIUM 10.4 11/20/2023   EGFR 48 11/20/2023   GFRNONAA 85 08/10/2020   Lab Results  Component Value Date   CHOL 197 06/13/2022   HDL 52 06/13/2022   LDLCALC 104 06/13/2022   TRIG 243 (A) 06/13/2022   CHOLHDL 4.2 06/22/2021   Lab Results   Component Value Date   TSH 1.74 06/13/2022   Lab Results  Component Value Date   HGBA1C 5.7 06/13/2022   Lab Results  Component Value Date   WBC 8.6 11/20/2023   HGB 12.8 11/20/2023   HCT 38 11/20/2023   MCV 95 06/22/2021   PLT 291 06/13/2022   Lab Results  Component Value Date   ALT 38 (A) 09/22/2023   AST 44 (A) 09/22/2023   ALKPHOS 193 (A) 09/22/2023   BILITOT 0.3 06/22/2021   Lab Results  Component Value Date   VD25OH 108 06/13/2022     Patient Active Problem List   Diagnosis Date Noted   Urinary tract obstruction by kidney stone 09/22/2023   Elevated alkaline phosphatase level 09/22/2023   Hyperbilirubinemia 09/22/2023   Insomnia disorder 06/22/2021   Chronic systolic heart failure (HCC) 08/08/2018   Low blood magnesium 05/28/2017   LBBB (left bundle branch block) 04/25/2017   Hx-TIA (transient ischemic attack) 07/15/2015   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   Hyperlipidemia, mild 07/15/2015   Hot flash, menopausal 07/15/2015   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   Venous insufficiency of leg 07/15/2015    Allergies  Allergen Reactions   Bee Venom Swelling and Hives    Past Surgical History:  Procedure Laterality Date   BREAST ENHANCEMENT SURGERY  2004   EXTRACORPOREAL SHOCK WAVE LITHOTRIPSY  03/2018   LAPAROSCOPIC CHOLECYSTECTOMY  09/2021   subtotal due to adhesion of the gall bladder wall to the liver   Left cystoscopy/ureteroscopy/laser basket-extraction/stent/retrograde pyelogram Left 10/21/2023   LEFT HEART CATH AND CORONARY ANGIOGRAPHY  04/25/2017   THYROIDECTOMY, PARTIAL  1998   MNG    Social History   Tobacco Use   Smoking status: Never   Smokeless tobacco: Never  Vaping Use   Vaping status: Never Used  Substance Use Topics   Alcohol use: No    Alcohol/week: 0.0 standard drinks of alcohol   Drug use: No     Medication list has been reviewed and updated.  Current Meds  Medication Sig   conjugated estrogens  (PREMARIN) vaginal cream PLACE VAGINALLY DAILY.   ENTRESTO 24-26 MG Take 1 tablet by mouth 2 (two) times daily.   KLOR-CON M20 20 MEQ tablet Take 20 mEq by mouth daily.   metoprolol succinate (TOPROL-XL) 25 MG 24 hr tablet Take 25 mg by mouth daily.   NON FORMULARY Testosterone pellet implants   PREMPHASE 0.625-5 MG TABS tablet TAKE 1 TABLET BY MOUTH EVERY DAY   RETIN-A 0.1 % cream APPLY TO AFFECTED AREA EVERY DAY AT BEDTIME   vitamin B-12 (CYANOCOBALAMIN) 1000 MCG tablet Take by mouth.   [DISCONTINUED] ALPRAZolam (XANAX) 0.5 MG tablet TAKE 1 TABLET BY MOUTH AT BEDTIME AS NEEDED FOR ANXIETY.   [DISCONTINUED] levothyroxine (SYNTHROID) 150 MCG tablet Take 1 tablet (150 mcg total) by mouth daily.       11/27/2023   10:51 AM 05/14/2023   11:15 AM 06/13/2022   11:07 AM 06/26/2021    4:20 PM  GAD 7 : Generalized Anxiety Score  Nervous, Anxious, on Edge 0 0 0 0  Control/stop worrying 0 0 0 0  Worry too much - different things 0 0 0 0  Trouble relaxing 0 0 0 0  Restless 0 0 0 0  Easily annoyed or irritable 0 0 0 0  Afraid - awful might happen 0 0 0 0  Total GAD 7 Score 0 0 0 0  Anxiety Difficulty Not difficult at all Not difficult at all Not difficult at all        11/27/2023   10:51 AM 05/14/2023   11:15 AM 06/13/2022   11:07 AM  Depression screen PHQ 2/9  Decreased Interest 0 0 3  Down, Depressed, Hopeless 0 0 3  PHQ - 2 Score 0 0 6  Altered sleeping 0 0 0  Tired, decreased energy 0 0 0  Change in appetite 0 0 0  Feeling bad or failure about yourself  0 0 0  Trouble concentrating 0 0 0  Moving slowly or fidgety/restless 0 0 0  Suicidal thoughts 0 0 0  PHQ-9 Score 0 0 6  Difficult doing work/chores Not difficult at all Not difficult at all Not difficult at all    BP Readings from Last 3 Encounters:  11/27/23 124/64  05/14/23 122/68  06/13/22 136/76    Physical Exam Vitals and nursing note reviewed.  Constitutional:      General: She is not in acute distress.     Appearance: Normal appearance. She is well-developed.  HENT:     Head: Normocephalic and atraumatic.  Neck:     Vascular: No carotid bruit.  Cardiovascular:     Rate and Rhythm: Normal rate and regular rhythm.     Heart sounds: No murmur heard. Pulmonary:     Effort: Pulmonary effort is normal. No respiratory distress.     Breath sounds: No wheezing or rhonchi.  Musculoskeletal:     Cervical back: Normal range of motion.     Right lower leg: No edema.     Left lower leg: No edema.  Lymphadenopathy:     Cervical: No cervical adenopathy.  Skin:    General: Skin is warm and dry.     Capillary Refill: Capillary refill takes less than 2 seconds.     Findings: No rash.  Neurological:     General: No focal deficit present.     Mental Status: She is alert and oriented to person, place, and time.  Psychiatric:        Mood and Affect: Mood normal.        Behavior: Behavior normal.     Wt Readings from Last 3 Encounters:  11/27/23 180 lb (81.6 kg)  05/14/23 184 lb (83.5 kg)  06/13/22 189 lb (85.7 kg)    BP 124/64   Pulse 86   Ht 5\' 10"  (1.778 m)   Wt 180 lb (81.6 kg)   LMP  (LMP Unknown)   SpO2 97%   BMI 25.83 kg/m   Assessment and Plan:  Problem List Items Addressed This Visit       Unprioritized   Post-infectious hypothyroidism - Primary (Chronic)    Supplemented; recent T4 slightly high. Thyroid Stimulating Hormone (TSH) 0.34 - 5.66 IU/mL 5.04  Thyroxine, Free (FT4) 0.52 - 1.21 ng/dL 1.30 High         Relevant Medications   SYNTHROID 150 MCG tablet   Other Relevant Orders   TSH + free T4   Chronic systolic heart failure (HCC) (Chronic)    Being followed by Cardiology Entresto dose decreased recently; continues on metoprolol. No lipid panel in the past 12 months      Insomnia disorder (Chronic)    Takes Xanax nightly for sleep. No evidence of misuse or diversion.      Relevant Medications   ALPRAZolam (XANAX) 0.5 MG tablet   Urinary tract  obstruction by kidney stone   Relevant Orders   POCT Urinalysis Dipstick   Urine Culture   Elevated alkaline phosphatase level   Relevant Orders   Comprehensive metabolic panel   Hyperbilirubinemia   Relevant Orders   Comprehensive metabolic panel   Other Visit Diagnoses     Acute kidney injury [facility])       hospitalized last month with urosepsis. Had renal stone removed UA appears infected - will get Cx rec seeing Urology soon   Relevant Orders   Comprehensive metabolic panel       Return in about 4 months (around 03/27/2024) for med refills.    Reubin Milan, MD [facility] Health Primary Care and Sports Medicine [place]

## 2023-11-27 NOTE — Assessment & Plan Note (Addendum)
Being followed by Cardiology Entresto dose decreased recently; continues on metoprolol. No lipid panel in the past 12 months

## 2023-11-28 ENCOUNTER — Ambulatory Visit: Payer: Self-pay

## 2023-11-28 ENCOUNTER — Other Ambulatory Visit: Payer: Self-pay | Admitting: Internal Medicine

## 2023-11-28 DIAGNOSIS — E033 Postinfectious hypothyroidism: Secondary | ICD-10-CM

## 2023-11-28 LAB — COMPREHENSIVE METABOLIC PANEL
ALT: 25 [IU]/L (ref 0–32)
AST: 18 [IU]/L (ref 0–40)
Albumin: 4 g/dL (ref 3.8–4.8)
Alkaline Phosphatase: 139 [IU]/L — ABNORMAL HIGH (ref 44–121)
BUN/Creatinine Ratio: 18 (ref 12–28)
BUN: 24 mg/dL (ref 8–27)
Bilirubin Total: 0.3 mg/dL (ref 0.0–1.2)
CO2: 20 mmol/L (ref 20–29)
Calcium: 9.4 mg/dL (ref 8.7–10.3)
Chloride: 104 mmol/L (ref 96–106)
Creatinine, Ser: 1.3 mg/dL — ABNORMAL HIGH (ref 0.57–1.00)
Globulin, Total: 3.3 g/dL (ref 1.5–4.5)
Glucose: 101 mg/dL — ABNORMAL HIGH (ref 70–99)
Potassium: 4 mmol/L (ref 3.5–5.2)
Sodium: 140 mmol/L (ref 134–144)
Total Protein: 7.3 g/dL (ref 6.0–8.5)
eGFR: 42 mL/min/{1.73_m2} — ABNORMAL LOW (ref >59)

## 2023-11-28 LAB — TSH+FREE T4
Free T4: 1.92 ng/dL — ABNORMAL HIGH (ref 0.82–1.77)
TSH: 1.68 u[IU]/mL (ref 0.450–4.500)

## 2023-11-28 MED ORDER — SYNTHROID 88 MCG PO TABS: 88.0000 ug | ORAL_TABLET | Freq: Every day | ORAL | 1 refills | Status: DC

## 2023-11-28 NOTE — Telephone Encounter (Signed)
  Chief Complaint: lab results given. Pt would like to know urine results Disposition: [] ED /[] Urgent Care (no appt availability in office) / [] Appointment(In office/virtual)/ []  [place] [facility]/ [x] Home Care/ [] Refused Recommended Disposition /[] [place] [facility]/ []  Follow-up with PCP Additional Notes: call trsferred to urologist office for f/u Reason for Disposition  Caller requesting lab results  (Exception: Routine or non-urgent lab result.)  Answer Assessment - Initial Assessment Questions 1. REASON FOR CALL or QUESTION: "What is your reason for calling today?" or "How can I best help you?" or "What question do you have that I can help answer?"     Pt called for lab results written by Dr. Judithann Graves given to pt 11/28/23. Call transferred to urologists office.  Answer Assessment - Initial Assessment Questions 1. REASON FOR CALL or QUESTION: "What is your reason for calling today?" or "How can I best help you?" or "What question do you have that I can help answer?"     Lab results 2. CALLER: Document the source of call. (e.g., laboratory, patient).     pt  Protocols used: Information Only Call - No Triage-A-AH, PCP Call - No Triage-A-AH

## 2023-11-28 NOTE — Telephone Encounter (Signed)
Pt received lab results.  KP

## 2023-11-29 ENCOUNTER — Ambulatory Visit: Payer: Self-pay | Admitting: *Deleted

## 2023-11-29 NOTE — Telephone Encounter (Signed)
  Chief Complaint: Returned call regarding her urine culture result.   I let her know it wasn't back yet but as soon as it was we would give her a call. Symptoms: N/A Frequency: N/A Pertinent Negatives: Patient denies N/A Disposition: [] ED /[] Urgent Care (no appt availability in office) / [] Appointment(In office/virtual)/ []  [place] [facility]/ [] Home Care/ [] Refused Recommended Disposition /[] [place] Mobile Bus/ [x]  Follow-up with PCP Additional Notes: I read her the last message from Dr. Judithann Graves  from 12:40 PM.

## 2023-11-29 NOTE — Telephone Encounter (Signed)
Reason for Disposition  [1] Follow-up call to recent contact AND [2] information only call, no triage required  Answer Assessment - Initial Assessment Questions 1. REASON FOR CALL or QUESTION: "What is your reason for calling today?" or "How can I best help you?" or "What question do you have that I can help answer?"     Pt returned a call regarding her urine results.   The urine culture is not back yet.    I let her know we would contact her once those results are in.   I also read her the message from Dr. Judithann Graves.  Protocols used: Information Only Call - No Triage-A-AH

## 2023-11-30 LAB — URINE CULTURE

## 2023-12-05 ENCOUNTER — Other Ambulatory Visit: Payer: Self-pay | Admitting: Internal Medicine

## 2023-12-05 DIAGNOSIS — E033 Postinfectious hypothyroidism: Secondary | ICD-10-CM

## 2023-12-05 NOTE — Telephone Encounter (Signed)
Requested Prescriptions  Refused Prescriptions Disp Refills   SYNTHROID 150 MCG tablet [Pharmacy Med Name: SYNTHROID 150 MCG TABLET] 90 tablet 3    Sig: TAKE 1 TABLET BY MOUTH EVERY DAY     Endocrinology:  Hypothyroid Agents Passed - 12/05/2023  2:58 PM      Passed - TSH in normal range and within 360 days    TSH  Date Value Ref Range Status  11/27/2023 1.680 0.450 - 4.500 uIU/mL Final         Passed - Valid encounter within last 12 months    Recent Outpatient Visits           1 week ago Post-infectious hypothyroidism   [place] [facility] Medicine at [facility], Nyoka Cowden, MD   6 months ago Chronic systolic heart failure [facility])   [place] Primary Care & Sports Medicine at [facility], Nyoka Cowden, MD   1 year ago Post-infectious hypothyroidism   [place] [facility] Medicine at [facility], Nyoka Cowden, MD   2 years ago Viral URI   [facility] Primary Care & Sports Medicine at [facility], Nyoka Cowden, MD   2 years ago Post-infectious hypothyroidism   [facility] Health Primary Care & Sports Medicine at [facility], Nyoka Cowden, MD       Future Appointments             In 1 month Judithann Graves, Nyoka Cowden, MD [facility] Health Primary Care & Sports Medicine at [facility], [facility]

## 2023-12-22 ENCOUNTER — Other Ambulatory Visit: Payer: Self-pay | Admitting: Internal Medicine

## 2023-12-22 DIAGNOSIS — E033 Postinfectious hypothyroidism: Secondary | ICD-10-CM

## 2023-12-24 NOTE — Telephone Encounter (Signed)
 Requested Prescriptions  Refused Prescriptions Disp Refills   SYNTHROID  150 MCG tablet [Pharmacy Med Name: SYNTHROID  150 MCG TABLET] 90 tablet 3    Sig: TAKE 1 TABLET BY MOUTH EVERY DAY     Endocrinology:  Hypothyroid Agents Passed - 12/24/2023  4:33 PM      Passed - TSH in normal range and within 360 days    TSH  Date Value Ref Range Status  11/27/2023 1.680 0.450 - 4.500 uIU/mL Final         Passed - Valid encounter within last 12 months    Recent Outpatient Visits           3 weeks ago Post-infectious hypothyroidism   [place] [facility] Medicine at [facility], Leita DEL, MD   7 months ago Chronic systolic heart failure [facility])   [place] Primary Care & Sports Medicine at [facility] Leita DEL, MD   1 year ago Post-infectious hypothyroidism   [place] Primary Care & Sports Medicine at [facility], Leita DEL, MD   2 years ago Viral URI   [facility] Health Primary Care & Sports Medicine at [facility], Leita DEL, MD   2 years ago Post-infectious hypothyroidism   [facility] Health Primary Care & Sports Medicine at [facility], Leita DEL, MD       Future Appointments             In 1 month Justus, Leita DEL, MD [facility] Health Primary Care & Sports Medicine at [facility], [facility]

## 2024-01-16 LAB — BASIC METABOLIC PANEL
BUN: 32 — AB (ref 4–21)
CO2: 27 — AB (ref 13–22)
Chloride: 101 (ref 99–108)
Creatinine: 1.1 (ref 0.5–1.1)
Potassium: 4.6 meq/L (ref 3.5–5.1)
Sodium: 140 (ref 137–147)

## 2024-01-16 LAB — COMPREHENSIVE METABOLIC PANEL: eGFR: 50

## 2024-01-20 ENCOUNTER — Other Ambulatory Visit: Payer: Self-pay | Admitting: Internal Medicine

## 2024-01-21 NOTE — Telephone Encounter (Signed)
 Requested Prescriptions  Pending Prescriptions Disp Refills   RETIN-A  0.1 % cream [Pharmacy Med Name: RETIN-A  0.1% CREAM] 45 g 1    Sig: APPLY TO AFFECTED AREA EVERY DAY AT BEDTIME     Dermatology:  Acne preparations Passed - 01/21/2024  3:26 PM      Passed - Valid encounter within last 12 months    Recent Outpatient Visits           1 month ago Post-infectious hypothyroidism   [place] [facility] Medicine at [facility], Leita DEL, MD   8 months ago Chronic systolic heart failure [facility])   [place] Primary Care & Sports Medicine at [facility], Leita DEL, MD   1 year ago Post-infectious hypothyroidism   [place] Primary Care & Sports Medicine at [facility], Leita DEL, MD   2 years ago Viral URI   [facility] Primary Care & Sports Medicine at [facility], Leita DEL, MD   2 years ago Post-infectious hypothyroidism   [facility] Primary Care & Sports Medicine at [facility], Leita DEL, MD       Future Appointments             In 6 days Justus Leita DEL, MD [facility] Health Primary Care & Sports Medicine at [facility], [facility]

## 2024-01-26 ENCOUNTER — Other Ambulatory Visit: Payer: Self-pay | Admitting: Internal Medicine

## 2024-01-26 DIAGNOSIS — N951 Menopausal and female climacteric states: Secondary | ICD-10-CM

## 2024-01-27 ENCOUNTER — Encounter: Payer: Self-pay | Admitting: Internal Medicine

## 2024-01-27 ENCOUNTER — Ambulatory Visit (INDEPENDENT_AMBULATORY_CARE_PROVIDER_SITE_OTHER): Payer: Medicare Other | Admitting: Internal Medicine

## 2024-01-27 VITALS — BP 120/74 | HR 70 | Ht 70.0 in | Wt 192.0 lb

## 2024-01-27 DIAGNOSIS — N1831 Chronic kidney disease, stage 3a: Secondary | ICD-10-CM | POA: Insufficient documentation

## 2024-01-27 DIAGNOSIS — E033 Postinfectious hypothyroidism: Secondary | ICD-10-CM | POA: Diagnosis not present

## 2024-01-27 NOTE — Assessment & Plan Note (Signed)
 Renal function improved as of 10 days ago She reports seeing Urology for follow up from sepsis and kidneys appear normal. Recommend maintaining good hydration and avoiding NSAIDS

## 2024-01-27 NOTE — Assessment & Plan Note (Signed)
 Dose decreased last visit. Lab Results  Component Value Date   TSH 1.680 11/27/2023   T4TOTAL 11.8 05/28/2017

## 2024-01-27 NOTE — Progress Notes (Signed)
 Date:  01/27/2024   Name:  Debbie Hubbard   DOB:  01-May-1946   MRN:  [id number]   Chief Complaint: Hypothyroidism  Thyroid  Problem Presents for follow-up (dose reduced slightly due to high T4) visit. Patient reports no anxiety, fatigue or palpitations. The symptoms have been improving.  Hypertension This is a chronic problem. The problem is controlled. Pertinent negatives include no chest pain, headaches, palpitations or shortness of breath. Past treatments include angiotensin blockers and beta blockers. The current treatment provides significant improvement. Hypertensive end-organ damage includes kidney disease, CAD/MI and CVA. Identifiable causes of hypertension include a thyroid  problem.  Renal insuff - noted 2 months ago.  Repeated by Cardiology 10 days ago and had improved almost to baseline.  Renal US  per patient report was normal.  Review of Systems  Constitutional:  Negative for chills, fatigue and fever.  Respiratory:  Negative for chest tightness and shortness of breath.   Cardiovascular:  Negative for chest pain and palpitations.  Neurological:  Negative for dizziness and headaches.  Psychiatric/Behavioral:  Negative for dysphoric mood and sleep disturbance. The patient is not nervous/anxious.      Lab Results  Component Value Date   NA 140 01/16/2024   K 4.6 01/16/2024   CO2 27 (A) 01/16/2024   GLUCOSE 101 (H) 11/27/2023   BUN 32 (A) 01/16/2024   CREATININE 1.1 01/16/2024   CALCIUM 9.4 11/27/2023   EGFR 50 01/16/2024   GFRNONAA 85 08/10/2020   Lab Results  Component Value Date   CHOL 197 06/13/2022   HDL 52 06/13/2022   LDLCALC 104 06/13/2022   TRIG 243 (A) 06/13/2022   CHOLHDL 4.2 06/22/2021   Lab Results  Component Value Date   TSH 1.680 11/27/2023   Lab Results  Component Value Date   HGBA1C 5.7 06/13/2022   Lab Results  Component Value Date   WBC 8.6 11/20/2023   HGB 12.8 11/20/2023   HCT 38 11/20/2023   MCV 95 06/22/2021   PLT 291  06/13/2022   Lab Results  Component Value Date   ALT 25 11/27/2023   AST 18 11/27/2023   ALKPHOS 139 (H) 11/27/2023   BILITOT 0.3 11/27/2023   Lab Results  Component Value Date   VD25OH 108 06/13/2022     Patient Active Problem List   Diagnosis Date Noted   Stage 3a chronic kidney disease (HCC) 01/27/2024   Urinary tract obstruction by kidney stone 09/22/2023   Elevated alkaline phosphatase level 09/22/2023   Hyperbilirubinemia 09/22/2023   Insomnia disorder 06/22/2021   Chronic systolic heart failure (HCC) 08/08/2018   Low blood magnesium 05/28/2017   LBBB (left bundle branch block) 04/25/2017   Hx-TIA (transient ischemic attack) 07/15/2015   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   Hyperlipidemia, mild 07/15/2015   Hot flash, menopausal 07/15/2015   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   Venous insufficiency of leg 07/15/2015    Allergies  Allergen Reactions   Bee Venom Swelling and Hives    Past Surgical History:  Procedure Laterality Date   BREAST ENHANCEMENT SURGERY  2004   EXTRACORPOREAL SHOCK WAVE LITHOTRIPSY  03/2018   LAPAROSCOPIC CHOLECYSTECTOMY  09/2021   subtotal due to adhesion of the gall bladder wall to the liver   Left cystoscopy/ureteroscopy/laser basket-extraction/stent/retrograde pyelogram Left 10/21/2023   LEFT HEART CATH AND CORONARY ANGIOGRAPHY  04/25/2017   THYROIDECTOMY, PARTIAL  1998   MNG    Social History   Tobacco Use   Smoking status: Never   Smokeless tobacco: Never  Vaping Use   Vaping status: Never Used  Substance Use Topics   Alcohol use: No    Alcohol/week: 0.0 standard drinks of alcohol   Drug use: No     Medication list has been reviewed and updated.  Current Meds  Medication Sig   ALPRAZolam  (XANAX ) 0.5 MG tablet Take 1 tablet (0.5 mg total) by mouth at bedtime as needed for anxiety.   conjugated estrogens  (PREMARIN ) vaginal cream PLACE VAGINALLY DAILY.   ENTRESTO 24-26 MG Take 1 tablet by mouth 2 (two)  times daily.   KLOR-CON M20 20 MEQ tablet Take 20 mEq by mouth daily.   metoprolol succinate (TOPROL-XL) 100 MG 24 hr tablet Take 200 mg by mouth daily. Take with or immediately following a meal.   NON FORMULARY Testosterone  pellet implants   PREMPHASE  0.625-5 MG TABS tablet TAKE 1 TABLET BY MOUTH EVERY DAY   RETIN-A  0.1 % cream APPLY TO AFFECTED AREA EVERY DAY AT BEDTIME   SYNTHROID  88 MCG tablet Take 1 tablet (88 mcg total) by mouth daily before breakfast.   vitamin B-12 (CYANOCOBALAMIN) 1000 MCG tablet Take by mouth.       01/27/2024    4:03 PM 11/27/2023   10:51 AM 05/14/2023   11:15 AM 06/13/2022   11:07 AM  GAD 7 : Generalized Anxiety Score  Nervous, Anxious, on Edge 0 0 0 0  Control/stop worrying 0 0 0 0  Worry too much - different things 0 0 0 0  Trouble relaxing 0 0 0 0  Restless 0 0 0 0  Easily annoyed or irritable 0 0 0 0  Afraid - awful might happen 0 0 0 0  Total GAD 7 Score 0 0 0 0  Anxiety Difficulty Not difficult at all Not difficult at all Not difficult at all Not difficult at all       01/27/2024    4:02 PM 11/27/2023   10:51 AM 05/14/2023   11:15 AM  Depression screen PHQ 2/9  Decreased Interest 0 0 0  Down, Depressed, Hopeless 0 0 0  PHQ - 2 Score 0 0 0  Altered sleeping 0 0 0  Tired, decreased energy 0 0 0  Change in appetite 0 0 0  Feeling bad or failure about yourself  0 0 0  Trouble concentrating 0 0 0  Moving slowly or fidgety/restless 0 0 0  Suicidal thoughts 0 0 0  PHQ-9 Score 0 0 0  Difficult doing work/chores Not difficult at all Not difficult at all Not difficult at all    BP Readings from Last 3 Encounters:  01/27/24 120/74  11/27/23 124/64  05/14/23 122/68    Physical Exam Vitals and nursing note reviewed.  Constitutional:      General: She is not in acute distress.    Appearance: Normal appearance. She is well-developed.  HENT:     Head: Normocephalic and atraumatic.  Neck:     Vascular: No carotid bruit.  Cardiovascular:      Rate and Rhythm: Normal rate and regular rhythm.  Pulmonary:     Effort: Pulmonary effort is normal. No respiratory distress.     Breath sounds: No wheezing or rhonchi.  Musculoskeletal:     Cervical back: Normal range of motion.     Right lower leg: No edema.     Left lower leg: No edema.  Lymphadenopathy:     Cervical: No cervical adenopathy.  Skin:    General: Skin is warm and dry.  Findings: No rash.  Neurological:     Mental Status: She is alert and oriented to person, place, and time.  Psychiatric:        Mood and Affect: Mood normal.        Behavior: Behavior normal.     Wt Readings from Last 3 Encounters:  01/27/24 192 lb (87.1 kg)  11/27/23 180 lb (81.6 kg)  05/14/23 184 lb (83.5 kg)    BP 120/74   Pulse 70   Ht 5\' 10"  (1.778 m)   Wt 192 lb (87.1 kg)   LMP  (LMP Unknown)   SpO2 97%   BMI 27.55 kg/m   Assessment and Plan:  Problem List Items Addressed This Visit       Unprioritized   Post-infectious hypothyroidism - Primary (Chronic)   Dose decreased last visit. Lab Results  Component Value Date   TSH 1.680 11/27/2023   T4TOTAL 11.8 05/28/2017         Relevant Medications   metoprolol succinate (TOPROL-XL) 100 MG 24 hr tablet   Other Relevant Orders   TSH + free T4   Stage 3a chronic kidney disease (HCC)   Renal function improved as of 10 days ago She reports seeing Urology for follow up from sepsis and kidneys appear normal. Recommend maintaining good hydration and avoiding NSAIDS       No follow-ups on file.    Sheron Dixons, MD [facility] Primary Care and Sports Medicine [place]

## 2024-01-28 ENCOUNTER — Other Ambulatory Visit: Payer: Self-pay

## 2024-01-28 DIAGNOSIS — E033 Postinfectious hypothyroidism: Secondary | ICD-10-CM

## 2024-01-28 DIAGNOSIS — N951 Menopausal and female climacteric states: Secondary | ICD-10-CM

## 2024-01-28 LAB — TSH+FREE T4
Free T4: 0.99 ng/dL (ref 0.82–1.77)
TSH: 6.21 u[IU]/mL — ABNORMAL HIGH (ref 0.450–4.500)

## 2024-01-28 MED ORDER — SYNTHROID 88 MCG PO TABS: 88.0000 ug | ORAL_TABLET | Freq: Every day | ORAL | 1 refills | Status: DC

## 2024-01-28 NOTE — Telephone Encounter (Signed)
Requested Prescriptions  Pending Prescriptions Disp Refills   PREMARIN vaginal cream [Pharmacy Med Name: PREMARIN VAGINAL CREAM-APPL] 30 g 2    Sig: PLACE VAGINALLY DAILY.     OB/GYN:  Estrogens Failed - 01/28/2024  8:23 AM      Failed - Mammogram is up-to-date per Health Maintenance      Passed - Last BP in normal range    BP Readings from Last 1 Encounters:  01/27/24 120/74         Passed - Valid encounter within last 12 months    Recent Outpatient Visits           2 months ago Post-infectious hypothyroidism   [place] [facility] at [facility], Nyoka Cowden, MD   8 months ago Chronic systolic heart failure [facility])   [place] [facility] Medicine at [facility], Nyoka Cowden, MD   1 year ago Post-infectious hypothyroidism   [place] [facility] Medicine at [facility], Nyoka Cowden, MD   2 years ago Viral URI   [facility] at [facility], Nyoka Cowden, MD   2 years ago Post-infectious hypothyroidism   [place] [facility] Medicine at [facility], Nyoka Cowden, MD              Refused Prescriptions Disp Refills   SYNTHROID 150 MCG tablet [Pharmacy Med Name: SYNTHROID 150 MCG TABLET] 90 tablet 3    Sig: TAKE 1 TABLET BY MOUTH EVERY DAY     Endocrinology:  Hypothyroid Agents Failed - 01/28/2024  8:23 AM      Failed - TSH in normal range and within 360 days    TSH  Date Value Ref Range Status  01/27/2024 6.210 (H) 0.450 - 4.500 uIU/mL Final         Passed - Valid encounter within last 12 months    Recent Outpatient Visits           2 months ago Post-infectious hypothyroidism   [place] [facility] Medicine at [facility], Nyoka Cowden, MD   8 months ago Chronic systolic heart failure [facility])   [place] [facility] Medicine at [facility], Nyoka Cowden, MD   1 year ago Post-infectious  hypothyroidism   [place] [facility] Medicine at [facility], Nyoka Cowden, MD   2 years ago Viral URI   [facility] Medicine at [facility], Nyoka Cowden, MD   2 years ago Post-infectious hypothyroidism   [facility] Primary Care & Sports Medicine at [facility], Nyoka Cowden, MD

## 2024-03-27 ENCOUNTER — Other Ambulatory Visit: Payer: Self-pay | Admitting: Internal Medicine

## 2024-03-27 NOTE — Telephone Encounter (Signed)
 Requested Prescriptions  Refused Prescriptions Disp Refills   SYNTHROID 150 MCG tablet [Pharmacy Med Name: SYNTHROID 150 MCG TABLET] 90 tablet 3    Sig: TAKE 1 TABLET BY MOUTH EVERY DAY     Endocrinology:  Hypothyroid Agents Failed - 03/27/2024  5:42 PM      Failed - TSH in normal range and within 360 days    TSH  Date Value Ref Range Status  01/27/2024 6.210 (H) 0.450 - 4.500 uIU/mL Final         Passed - Valid encounter within last 12 months    Recent Outpatient Visits           2 months ago Post-infectious hypothyroidism   [place] [facility] Medicine at [facility], Nyoka Cowden, MD       Future Appointments             In 4 months Judithann Graves, Nyoka Cowden, MD [facility] Primary Care & Sports Medicine at [facility], [facility]

## 2024-04-07 ENCOUNTER — Telehealth: Payer: Self-pay

## 2024-04-07 ENCOUNTER — Other Ambulatory Visit: Payer: Self-pay | Admitting: Internal Medicine

## 2024-04-07 NOTE — Telephone Encounter (Signed)
 Copied from CRM [id number]. Topic: Clinical - Prescription Issue >> Apr 07, 2024 11:19 AM Phil Braun wrote: Reason for CRM:    RETIN-A  0.1 % cream  Pt is wanting a high dose on this medication and wants you to give her a call back.

## 2024-04-07 NOTE — Telephone Encounter (Signed)
 Spoke with patient and informed her that you recommended dermatology eval. She will try and get in with them.  JM

## 2024-04-15 ENCOUNTER — Other Ambulatory Visit: Payer: Self-pay | Admitting: Internal Medicine

## 2024-04-16 ENCOUNTER — Other Ambulatory Visit (INDEPENDENT_AMBULATORY_CARE_PROVIDER_SITE_OTHER): Admitting: Internal Medicine

## 2024-04-16 DIAGNOSIS — Z436 Encounter for attention to other artificial openings of urinary tract: Secondary | ICD-10-CM | POA: Insufficient documentation

## 2024-04-16 DIAGNOSIS — E872 Acidosis, unspecified: Secondary | ICD-10-CM

## 2024-04-16 DIAGNOSIS — R000 Tachycardia, unspecified: Secondary | ICD-10-CM | POA: Diagnosis not present

## 2024-04-16 DIAGNOSIS — B961 Klebsiella pneumoniae [K. pneumoniae] as the cause of diseases classified elsewhere: Secondary | ICD-10-CM | POA: Insufficient documentation

## 2024-04-16 DIAGNOSIS — N171 Acute kidney failure with acute cortical necrosis: Secondary | ICD-10-CM

## 2024-04-16 DIAGNOSIS — N12 Tubulo-interstitial nephritis, not specified as acute or chronic: Secondary | ICD-10-CM

## 2024-04-16 DIAGNOSIS — K759 Inflammatory liver disease, unspecified: Secondary | ICD-10-CM

## 2024-04-16 DIAGNOSIS — I5022 Chronic systolic (congestive) heart failure: Secondary | ICD-10-CM

## 2024-04-16 DIAGNOSIS — A419 Sepsis, unspecified organism: Secondary | ICD-10-CM

## 2024-04-16 DIAGNOSIS — E033 Postinfectious hypothyroidism: Secondary | ICD-10-CM

## 2024-04-16 HISTORY — DX: Hypercalcemia: E83.52

## 2024-04-16 HISTORY — DX: Acidosis, unspecified: E87.20

## 2024-04-16 HISTORY — DX: Inflammatory liver disease, unspecified: K75.9

## 2024-04-16 HISTORY — DX: Klebsiella pneumoniae (k. pneumoniae) as the cause of diseases classified elsewhere: B96.1

## 2024-04-16 HISTORY — DX: Tubulo-interstitial nephritis, not specified as acute or chronic: N12

## 2024-04-16 HISTORY — DX: Acute kidney failure with acute cortical necrosis: N17.1

## 2024-04-16 HISTORY — DX: Sepsis, unspecified organism: A41.9

## 2024-04-16 NOTE — Progress Notes (Signed)
 Received home health orders orders from [facility]. Start of care 09/26/23.   Certification and orders from 09/26/23 through 11/24/23 are reviewed, signed and faxed back to home health company.  Need of intermittent skilled services at home: home bound  The home health care plan has been established by me and will be reviewed and updated as needed to maximize patient recovery.  I certify that all home health services have been and will be furnished to the patient while under my care.  Face-to-face encounter in which the need for home health services was established: at hospital discharge.  Patient is receiving home health services for the following diagnoses: Problem List Items Addressed This Visit       Unprioritized   Post-infectious hypothyroidism (Chronic)   Chronic systolic heart failure (HCC) (Chronic)   Tachycardia, unspecified - Primary   Shock, septic (HCC)   Acute kidney failure with acute cortical necrosis (HCC)   Interstitial nephritis   Klebsiella pneumoniae (k. 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

## 2024-04-18 NOTE — Telephone Encounter (Signed)
 Too soon for refill.  Requested Prescriptions  Pending Prescriptions Disp Refills   RETIN-A  0.1 % cream [Pharmacy Med Name: RETIN-A  0.1% CREAM] 45 g 1    Sig: APPLY TO AFFECTED AREA EVERY DAY AT BEDTIME     Dermatology:  Acne preparations Passed - 04/18/2024 10:36 AM      Passed - Valid encounter within last 12 months    Recent Outpatient Visits           2 months ago Post-infectious hypothyroidism   [place] [facility] Medicine at [facility], Chales [place], MD       Future Appointments             In 3 months Gala Jubilee [place], MD [facility] Primary Care & Sports Medicine at [facility], [facility]             SYNTHROID  150 MCG tablet [Pharmacy Med Name: SYNTHROID  150 MCG TABLET] 90 tablet 3    Sig: TAKE 1 TABLET BY MOUTH EVERY DAY     Endocrinology:  Hypothyroid Agents Failed - 04/18/2024 10:36 AM      Failed - TSH in normal range and within 360 days    TSH  Date Value Ref Range Status  01/27/2024 6.210 (H) 0.450 - 4.500 uIU/mL Final         Passed - Valid encounter within last 12 months    Recent Outpatient Visits           2 months ago Post-infectious hypothyroidism   [place] [facility] Medicine at [facility], Chales [place], MD       Future Appointments             In 3 months Gala Jubilee, Chales [place], MD [facility] Health Primary Care & Sports Medicine at [facility], [facility]

## 2024-04-22 ENCOUNTER — Other Ambulatory Visit: Payer: Self-pay | Admitting: Internal Medicine

## 2024-04-22 ENCOUNTER — Encounter: Payer: Self-pay | Admitting: Internal Medicine

## 2024-04-22 DIAGNOSIS — E033 Postinfectious hypothyroidism: Secondary | ICD-10-CM

## 2024-04-22 NOTE — Telephone Encounter (Signed)
 [facility]oned [facility] and verified that prescription is there and can not be refilled before 05/05/24.  Phoned and notified pt of this.  Copied from CRM [id number]. Topic: Clinical - Prescription Issue >> Apr 22, 2024 12:08 PM Debbie Hubbard wrote: Reason for CRM: Patient calling due to pharmacy not refilling her SYNTHROID  88 MCG tablet, pt states needs it approved, she is out of meds and going out of town

## 2024-04-22 NOTE — Telephone Encounter (Signed)
 This encounter was created in error - please disregard.

## 2024-04-22 NOTE — Telephone Encounter (Signed)
 Copied from CRM [id number]. Topic: Clinical - Medication Refill >> Apr 22, 2024  3:19 PM Debbie Hubbard S wrote: Medication: SYNTHROID  88 MCG tablet  Has the patient contacted their pharmacy? Yes (Agent: If no, request that the patient contact the pharmacy for the refill. If patient does not wish to contact the pharmacy document the reason why and proceed with request.) (Agent: If yes, when and what did the pharmacy advise?)  This is the patient's preferred pharmacy:  [id number] [place], [place] - [address] AT [address] [place] [address] Phone: [phone] Fax: [phone]  Is this the correct pharmacy for this prescription? Yes If no, delete pharmacy and type the correct one.   Has the prescription been filled recently? No  Is the patient out of the medication? Yes  Has the patient been seen for an appointment in the last year OR does the patient have an upcoming appointment? Yes  Can we respond through MyChart? Yes  Agent: Please be advised that Rx refills may take up to 3 business days. We ask that you follow-up with your pharmacy.

## 2024-04-22 NOTE — Telephone Encounter (Signed)
 Duplicate encounter

## 2024-04-23 ENCOUNTER — Telehealth: Payer: Self-pay

## 2024-04-23 NOTE — Telephone Encounter (Signed)
 Spoke with patient. Discussed her previous message about the lab results from a different provider. I gave her options of dropping off paperwork, signing up for MyChart and faxing us  the paperwork. I emailed her the link with the activation code for MyChart and provided our office fax number to have her fax the information over. She verbalized understanding.

## 2024-04-23 NOTE — Telephone Encounter (Signed)
 Copied from CRM [id number]. Topic: General - Other >> Apr 23, 2024  1:36 PM Santiya F wrote: Reason for CRM: Patient is calling in because she received lab results from a different provider and wanted to send them to Dr. Gala Jubilee. Patient does not have MyChart and is unable to bring them in person. Asked patient if the doctor who requested them could send them over and she said no. Patient is requesting someone give her a call.

## 2024-05-10 ENCOUNTER — Other Ambulatory Visit: Payer: Self-pay | Admitting: Internal Medicine

## 2024-05-12 ENCOUNTER — Other Ambulatory Visit: Payer: Self-pay | Admitting: Internal Medicine

## 2024-05-12 DIAGNOSIS — N951 Menopausal and female climacteric states: Secondary | ICD-10-CM

## 2024-05-13 NOTE — Telephone Encounter (Signed)
 Requested Prescriptions  Pending Prescriptions Disp Refills   RETIN-A  0.1 % cream [Pharmacy Med Name: RETIN-A  0.1% CREAM] 45 g 1    Sig: APPLY TO AFFECTED AREA EVERY DAY AT BEDTIME     Dermatology:  Acne preparations Passed - 05/13/2024  3:47 PM      Passed - Valid encounter within last 12 months    Recent Outpatient Visits           3 months ago Post-infectious hypothyroidism   [place] [facility] Medicine at [facility], Chales [place], MD       Future Appointments             In 2 months Gala Jubilee, Chales [place], MD [facility] Health Primary Care & Sports Medicine at [facility], [facility]

## 2024-05-15 NOTE — Telephone Encounter (Signed)
 Requested Prescriptions  Pending Prescriptions Disp Refills   conjugated estrogens -medroxyprogesteron (PREMPHASE ) TABS tablet [Pharmacy Med Name: PREMPHASE  0.625-5 MG TABLET] 84 tablet 0    Sig: TAKE 1 TABLET BY MOUTH EVERY DAY     OB/GYN:  Hormone Combinations Failed - 05/15/2024  9:15 AM      Failed - Mammogram is up-to-date per Health Maintenance      Passed - Last BP in normal range    BP Readings from Last 1 Encounters:  01/27/24 120/74         Passed - Valid encounter within last 12 months    Recent Outpatient Visits           3 months ago Post-infectious hypothyroidism   [place] [facility] Care [facility] Sports Medicine at [facility], Chales [place], MD       Future Appointments             In 2 months Gala Jubilee Chales [place], MD [facility] Health Primary Care & Sports Medicine at [facility], [facility]             SYNTHROID  150 MCG tablet [Pharmacy Med Name: SYNTHROID  150 MCG TABLET] 90 tablet 3    Sig: TAKE 1 TABLET BY MOUTH EVERY DAY     Endocrinology:  Hypothyroid Agents Failed - 05/15/2024  9:15 AM      Failed - TSH in normal range and within 360 days    TSH  Date Value Ref Range Status  01/27/2024 6.210 (H) 0.450 - 4.500 uIU/mL Final         Passed - Valid encounter within last 12 months    Recent Outpatient Visits           3 months ago Post-infectious hypothyroidism   [place] [facility] Care & Sports Medicine at [facility], Chales [place], MD       Future Appointments             In 2 months Gala Jubilee, Chales [place], MD [facility] Health Primary Care & Sports Medicine at [facility], [facility]

## 2024-05-15 NOTE — Telephone Encounter (Signed)
 Requested medication (s) are due for refill today: routing for review  Requested medication (s) are on the active medication list: no  Last refill:  unknown  Future visit scheduled: yes  Notes to clinic:  Unable to refill per protocol, Rx expired. Possible new Rx needed      Requested Prescriptions  Pending Prescriptions Disp Refills   SYNTHROID  150 MCG tablet [Pharmacy Med Name: SYNTHROID  150 MCG TABLET] 90 tablet 3    Sig: TAKE 1 TABLET BY MOUTH EVERY DAY     Endocrinology:  Hypothyroid Agents Failed - 05/15/2024  9:16 AM      Failed - TSH in normal range and within 360 days    TSH  Date Value Ref Range Status  01/27/2024 6.210 (H) 0.450 - 4.500 uIU/mL Final         Passed - Valid encounter within last 12 months    Recent Outpatient Visits           3 months ago Post-infectious hypothyroidism   [place] Primary Care & Sports Medicine at [facility], Chales [place], MD       Future Appointments             In 2 months Gala Jubilee Chales [place], MD [facility] Primary Care & Sports Medicine at [facility], [facility]            Signed Prescriptions Disp Refills   conjugated estrogens -medroxyprogesteron (PREMPHASE ) TABS tablet 84 tablet 0    Sig: TAKE 1 TABLET BY MOUTH EVERY DAY     OB/GYN:  Hormone Combinations Failed - 05/15/2024  9:16 AM      Failed - Mammogram is up-to-date per Health Maintenance      Passed - Last BP in normal range    BP Readings from Last 1 Encounters:  01/27/24 120/74         Passed - Valid encounter within last 12 months    Recent Outpatient Visits           3 months ago Post-infectious hypothyroidism   [place] [facility] Care & Sports Medicine at [facility], Chales [place], MD       Future Appointments             In 2 months Gala Jubilee, Chales [place], MD [facility] Health Primary Care & Sports Medicine at [facility], [facility]             pc

## 2024-06-08 ENCOUNTER — Other Ambulatory Visit: Payer: Self-pay | Admitting: Internal Medicine

## 2024-06-08 DIAGNOSIS — F5101 Primary insomnia: Secondary | ICD-10-CM

## 2024-06-08 NOTE — Telephone Encounter (Signed)
 Copied from CRM [id number]. Topic: Clinical - Medication Refill >> Jun 08, 2024 12:05 PM Carla L wrote: Medication: ALPRAZolam  (XANAX ) 0.5 MG tablet Pt requesting refill, pt requesting call if she needs to be seen sooner to have refill, [phone]  Has the patient contacted their pharmacy? Yes Pt decided to call to ensure rx request received.   This is the patient's preferred pharmacy:  [id number] [place], [place] - [address] AT [address] [place] [place] [address] Phone: [phone] Fax: [phone]  Is this the correct pharmacy for this prescription? Yes If no, delete pharmacy and type the correct one.   Has the prescription been filled recently? No  Is the patient out of the medication? No  Has the patient been seen for an appointment in the last year OR does the patient have an upcoming appointment? Yes  Can we respond through MyChart? No  Agent: Please be advised that Rx refills may take up to 3 business days. We ask that you follow-up with your pharmacy.

## 2024-06-10 NOTE — Telephone Encounter (Signed)
 Too soon for refill for Synthroid .  Requested Prescriptions  Pending Prescriptions Disp Refills   SYNTHROID  150 MCG tablet [Pharmacy Med Name: SYNTHROID  150 MCG TABLET] 90 tablet 3    Sig: TAKE 1 TABLET BY MOUTH EVERY DAY     Endocrinology:  Hypothyroid Agents Failed - 06/10/2024  8:42 AM      Failed - TSH in normal range and within 360 days    TSH  Date Value Ref Range Status  01/27/2024 6.210 (H) 0.450 - 4.500 uIU/mL Final         Passed - Valid encounter within last 12 months    Recent Outpatient Visits           4 months ago Post-infectious hypothyroidism   [place] [facility] at [facility], Leita DEL, MD       Future Appointments             In 1 month Justus Leita DEL, MD [facility] at [facility], [facility]             ALPRAZolam  (XANAX ) 0.5 MG tablet [Pharmacy Med Name: ALPRAZOLAM  0.5 MG TABLET] 30 tablet     Sig: TAKE 1 TABLET BY MOUTH AT BEDTIME AS NEEDED FOR ANXIETY.     Not Delegated - Psychiatry: Anxiolytics/Hypnotics 2 Failed - 06/10/2024  8:42 AM      Failed - This refill cannot be delegated      Failed - Urine Drug Screen completed in last 360 days      Passed - Patient is not pregnant      Passed - Valid encounter within last 6 months    Recent Outpatient Visits           4 months ago Post-infectious hypothyroidism   [place] [facility] at [facility], Leita DEL, MD       Future Appointments             In 1 month Justus, Leita DEL, MD [facility] at [facility], [facility]

## 2024-06-10 NOTE — Telephone Encounter (Signed)
 Please review.  KP

## 2024-06-10 NOTE — Telephone Encounter (Signed)
 Requested medication (s) are due for refill today: yes  Requested medication (s) are on the active medication list: yes  Last refill:  11/27/23  Future visit scheduled: yes  Notes to clinic:  Unable to refill per protocol, cannot delegate.      Requested Prescriptions  Pending Prescriptions Disp Refills   ALPRAZolam  (XANAX ) 0.5 MG tablet 30 tablet 5    Sig: Take 1 tablet (0.5 mg total) by mouth at bedtime as needed for anxiety.     Not Delegated - Psychiatry: Anxiolytics/Hypnotics 2 Failed - 06/10/2024  9:07 AM      Failed - This refill cannot be delegated      Failed - Urine Drug Screen completed in last 360 days      Passed - Patient is not pregnant      Passed - Valid encounter within last 6 months    Recent Outpatient Visits           4 months ago Post-infectious hypothyroidism   [place] [facility] at [facility], Leita DEL, MD       Future Appointments             In 1 month Justus, Leita DEL, MD [facility] at [facility], [facility]

## 2024-06-10 NOTE — Telephone Encounter (Signed)
 Requested medication (s) are due for refill today: yes  Requested medication (s) are on the active medication list: yes  Last refill:  11/27/23  Future visit scheduled: yes  Notes to clinic:  Unable to refill per protocol, cannot delegate.      Requested Prescriptions  Pending Prescriptions Disp Refills   ALPRAZolam  (XANAX ) 0.5 MG tablet [Pharmacy Med Name: ALPRAZOLAM  0.5 MG TABLET] 30 tablet     Sig: TAKE 1 TABLET BY MOUTH AT BEDTIME AS NEEDED FOR ANXIETY.     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   [place] [facility] at [facility], Leita DEL, MD       Future Appointments             In 1 month Justus, Leita DEL, MD [facility] at [facility], [facility]            Refused Prescriptions Disp Refills   SYNTHROID  150 MCG tablet [Pharmacy Med Name: SYNTHROID  150 MCG TABLET] 90 tablet 3    Sig: TAKE 1 TABLET BY MOUTH EVERY DAY     Endocrinology:  Hypothyroid Agents Failed - 06/10/2024  8:43 AM      Failed - TSH in normal range and within 360 days    TSH  Date Value Ref Range Status  01/27/2024 6.210 (H) 0.450 - 4.500 uIU/mL Final         Passed - Valid encounter within last 12 months    Recent Outpatient Visits           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

## 2024-06-10 NOTE — Telephone Encounter (Signed)
 Duplicate

## 2024-07-23 ENCOUNTER — Telehealth: Payer: Self-pay | Admitting: Internal Medicine

## 2024-07-23 NOTE — Telephone Encounter (Signed)
 Copied from CRM [id number]. Topic: General - Other >> Jul 23, 2024 12:47 PM [place] B wrote: Reason for CRM: Patient called in wanting to know if she is going to have blood work after her appt on 08/12

## 2024-07-28 ENCOUNTER — Ambulatory Visit: Payer: Medicare Other | Admitting: Internal Medicine

## 2024-08-09 ENCOUNTER — Other Ambulatory Visit: Payer: Self-pay | Admitting: Internal Medicine

## 2024-08-09 DIAGNOSIS — N951 Menopausal and female climacteric states: Secondary | ICD-10-CM

## 2024-08-10 LAB — COMPREHENSIVE METABOLIC PANEL WITH GFR
Calcium: 9.9 (ref 8.7–10.7)
eGFR: 42

## 2024-08-10 LAB — BASIC METABOLIC PANEL WITH GFR
BUN: 31 — AB (ref 4–21)
CO2: 27 — AB (ref 13–22)
Chloride: 104 (ref 99–108)
Creatinine: 1.3 — AB (ref 0.5–1.1)
Glucose: 92
Potassium: 4.3 meq/L (ref 3.5–5.1)
Sodium: 141 (ref 137–147)

## 2024-08-11 NOTE — Telephone Encounter (Signed)
 Requested medication (s) are due for refill today:   Yes  Requested medication (s) are on the active medication list:   Yes  Future visit scheduled:   Yes 9/4     Last ordered: 05/15/2024 #84, 0 refills  Unable to refill because mammogram is due per protocol    Requested Prescriptions  Pending Prescriptions Disp Refills   PREMPHASE  0.625-5 MG TABS tablet [Pharmacy Med Name: PREMPHASE  0.625-5 MG TABLET] 84 tablet 0    Sig: TAKE 1 TABLET BY MOUTH EVERY DAY     OB/GYN:  Hormone Combinations Failed - 08/11/2024 10:51 AM      Failed - Mammogram is up-to-date per Health Maintenance      Passed - Last BP in normal range    BP Readings from Last 1 Encounters:  01/27/24 120/74         Passed - Valid encounter within last 12 months    Recent Outpatient Visits           6 months ago Post-infectious hypothyroidism   [place] [facility] at [facility], Leita DEL, MD       Future Appointments             In 1 week Justus, Leita DEL, MD [facility] at [facility], [facility]

## 2024-08-20 ENCOUNTER — Ambulatory Visit (INDEPENDENT_AMBULATORY_CARE_PROVIDER_SITE_OTHER): Admitting: Internal Medicine

## 2024-08-20 ENCOUNTER — Encounter: Payer: Self-pay | Admitting: Internal Medicine

## 2024-08-20 VITALS — BP 122/72 | HR 76 | Ht 70.0 in | Wt 196.0 lb

## 2024-08-20 DIAGNOSIS — Z79890 Hormone replacement therapy (postmenopausal): Secondary | ICD-10-CM | POA: Insufficient documentation

## 2024-08-20 DIAGNOSIS — N1831 Chronic kidney disease, stage 3a: Secondary | ICD-10-CM | POA: Diagnosis not present

## 2024-08-20 DIAGNOSIS — F5101 Primary insomnia: Secondary | ICD-10-CM

## 2024-08-20 DIAGNOSIS — N951 Menopausal and female climacteric states: Secondary | ICD-10-CM | POA: Diagnosis not present

## 2024-08-20 DIAGNOSIS — I5022 Chronic systolic (congestive) heart failure: Secondary | ICD-10-CM

## 2024-08-20 DIAGNOSIS — E033 Postinfectious hypothyroidism: Secondary | ICD-10-CM | POA: Diagnosis not present

## 2024-08-20 MED ORDER — PREMARIN 0.625 MG/GM VA CREA: TOPICAL_CREAM | VAGINAL | 2 refills | Status: AC

## 2024-08-20 MED ORDER — SYNTHROID 88 MCG PO TABS: 88.0000 ug | ORAL_TABLET | Freq: Every day | ORAL | 1 refills | Status: AC

## 2024-08-20 MED ORDER — ALPRAZOLAM 0.5 MG PO TABS: 0.5000 mg | ORAL_TABLET | Freq: Every evening | ORAL | 5 refills | Status: AC | PRN

## 2024-08-20 NOTE — Progress Notes (Signed)
 Date:  08/20/2024   Name:  Debbie Hubbard   DOB:  Oct 11, 1946   MRN:  [id number]   Chief Complaint: Hypothyroidism  Thyroid  Problem Presents for follow-up visit. Patient reports no anxiety, depressed mood, diaphoresis, fatigue, palpitations or weight loss.  Renal insufficiency- she was seen by cardiology and GFR was 42.  They encouraged her to hydrate and have follow up labs.  Review of previous labs show GFR 50 one year ago.  Will repeat and plan to follow every 6 months.  Refer to Nephrology if rapidly worsening. HRT - she continues to take Premphase  daily.  She uses estrogen cream topically. She is also on testosterone  pellets from another provider.   CAD - she feels well, EF has improved.  She is exercising regularly.  No DOE and chest pains.  Review of Systems  Constitutional:  Negative for chills, diaphoresis, fatigue, unexpected weight change and weight loss.  HENT:  Negative for trouble swallowing.   Respiratory:  Negative for chest tightness, shortness of breath and wheezing.   Cardiovascular:  Negative for chest pain, palpitations and leg swelling.  Genitourinary:  Negative for dysuria and hematuria.  Neurological:  Negative for dizziness and headaches.  Psychiatric/Behavioral:  Negative for dysphoric mood and sleep disturbance. The patient is not nervous/anxious.      Lab Results  Component Value Date   NA 141 08/10/2024   K 4.3 08/10/2024   CO2 27 (A) 08/10/2024   GLUCOSE 101 (H) 11/27/2023   BUN 31 (A) 08/10/2024   CREATININE 1.3 (A) 08/10/2024   CALCIUM 9.9 08/10/2024   EGFR 42 08/10/2024   GFRNONAA 85 08/10/2020   Lab Results  Component Value Date   CHOL 197 06/13/2022   HDL 52 06/13/2022   LDLCALC 104 06/13/2022   TRIG 243 (A) 06/13/2022   CHOLHDL 4.2 06/22/2021   Lab Results  Component Value Date   TSH 6.210 (H) 01/27/2024   Lab Results  Component Value Date   HGBA1C 5.7 06/13/2022   Lab Results  Component Value Date   WBC 8.6 11/20/2023    HGB 12.8 11/20/2023   HCT 38 11/20/2023   MCV 95 06/22/2021   PLT 291 06/13/2022   Lab Results  Component Value Date   ALT 25 11/27/2023   AST 18 11/27/2023   ALKPHOS 139 (H) 11/27/2023   BILITOT 0.3 11/27/2023   Lab Results  Component Value Date   VD25OH 108 06/13/2022     Patient Active Problem List   Diagnosis Date Noted   Long-term current use of testosterone  replacement therapy 08/20/2024   Tachycardia, unspecified 04/16/2024   Stage 3a chronic kidney disease (HCC) 01/27/2024   Calculus of kidney 09/22/2023   Elevated alkaline phosphatase level 09/22/2023   Hyperbilirubinemia 09/22/2023   Insomnia disorder 06/22/2021   Chronic systolic heart failure (HCC) 08/08/2018   LBBB (left bundle branch block) 04/25/2017   Hx-TIA (transient ischemic attack) 07/15/2015   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   Hyperlipidemia, mild 07/15/2015   Hot flash, menopausal 07/15/2015   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   Venous insufficiency of leg 07/15/2015    Allergies  Allergen Reactions   Bee Venom Swelling and Hives    Past Surgical History:  Procedure Laterality Date   BREAST ENHANCEMENT SURGERY  2004   EXTRACORPOREAL SHOCK WAVE LITHOTRIPSY  03/2018   LAPAROSCOPIC CHOLECYSTECTOMY  09/2021   subtotal due to adhesion of the gall bladder wall to the liver   Left cystoscopy/ureteroscopy/laser basket-extraction/stent/retrograde pyelogram Left 10/21/2023  LEFT HEART CATH AND CORONARY ANGIOGRAPHY  04/25/2017   THYROIDECTOMY, PARTIAL  1998   MNG    Social History   Tobacco Use   Smoking status: Never   Smokeless tobacco: Never  Vaping Use   Vaping status: Never Used  Substance Use Topics   Alcohol use: No    Alcohol/week: 0.0 standard drinks of alcohol   Drug use: No     Medication list has been reviewed and updated.  Current Meds  Medication Sig   ENTRESTO 24-26 MG Take 1 tablet by mouth 2 (two) times daily.   KLOR-CON M20 20 MEQ tablet Take 20 mEq by  mouth daily.   metoprolol succinate (TOPROL-XL) 100 MG 24 hr tablet Take 200 mg by mouth daily. Take with or immediately following a meal.   NON FORMULARY Testosterone  pellet implants   PREMPHASE  0.625-5 MG TABS tablet TAKE 1 TABLET BY MOUTH EVERY DAY   RETIN-A  0.1 % cream APPLY TO AFFECTED AREA EVERY DAY AT BEDTIME   vitamin B-12 (CYANOCOBALAMIN) 1000 MCG tablet Take by mouth.   [DISCONTINUED] ALPRAZolam  (XANAX ) 0.5 MG tablet TAKE 1 TABLET BY MOUTH AT BEDTIME AS NEEDED FOR ANXIETY.   [DISCONTINUED] PREMARIN  vaginal cream PLACE VAGINALLY DAILY.   [DISCONTINUED] SYNTHROID  88 MCG tablet Take 1 tablet (88 mcg total) by mouth daily before breakfast.       08/20/2024   10:42 AM 01/27/2024    4:03 PM 11/27/2023   10:51 AM 05/14/2023   11:15 AM  GAD 7 : Generalized Anxiety Score  Nervous, Anxious, on Edge 0 0 0 0  Control/stop worrying 0 0 0 0  Worry too much - different things 0 0 0 0  Trouble relaxing 0 0 0 0  Restless 0 0 0 0  Easily annoyed or irritable 0 0 0 0  Afraid - awful might happen 0 0 0 0  Total GAD 7 Score 0 0 0 0  Anxiety Difficulty Not difficult at all Not difficult at all Not difficult at all Not difficult at all       08/20/2024   10:42 AM 01/27/2024    4:02 PM 11/27/2023   10:51 AM  Depression screen PHQ 2/9  Decreased Interest 0 0 0  Down, Depressed, Hopeless 0 0 0  PHQ - 2 Score 0 0 0  Altered sleeping 0 0 0  Tired, decreased energy 0 0 0  Change in appetite 0 0 0  Feeling bad or failure about yourself  0 0 0  Trouble concentrating 0 0 0  Moving slowly or fidgety/restless 0 0 0  Suicidal thoughts 0 0 0  PHQ-9 Score 0 0 0  Difficult doing work/chores Not difficult at all Not difficult at all Not difficult at all    BP Readings from Last 3 Encounters:  08/20/24 122/72  01/27/24 120/74  11/27/23 124/64    Physical Exam Vitals and nursing note reviewed.  Constitutional:      General: She is not in acute distress.    Appearance: Normal appearance. She is  well-developed.  HENT:     Head: Normocephalic and atraumatic.  Neck:     Vascular: No carotid bruit.  Cardiovascular:     Rate and Rhythm: Normal rate and regular rhythm.     Heart sounds: No murmur heard. Pulmonary:     Effort: Pulmonary effort is normal. No respiratory distress.     Breath sounds: No wheezing or rhonchi.  Musculoskeletal:     Cervical back: Normal range of motion.  Right lower leg: No edema.     Left lower leg: No edema.  Lymphadenopathy:     Cervical: No cervical adenopathy.  Skin:    General: Skin is warm and dry.     Findings: No rash.  Neurological:     General: No focal deficit present.     Mental Status: She is alert and oriented to person, place, and time.  Psychiatric:        Mood and Affect: Mood normal.        Behavior: Behavior normal.     Wt Readings from Last 3 Encounters:  08/20/24 196 lb (88.9 kg)  01/27/24 192 lb (87.1 kg)  11/27/23 180 lb (81.6 kg)    BP 122/72   Pulse 76   Ht 5' 10 (1.778 m)   Wt 196 lb (88.9 kg)   LMP  (LMP Unknown)   SpO2 96%   BMI 28.12 kg/m   Assessment and Plan:  Problem List Items Addressed This Visit       Unprioritized   Post-infectious hypothyroidism (Chronic)   Supplemented. Will refill medications and check thyroid  function.      Relevant Medications   SYNTHROID  88 MCG tablet   Other Relevant Orders   Thyroid  Panel With TSH   Chronic systolic heart failure (HCC) (Chronic)   Doing very well from cardiac standpoint. Recent evaluation showed EF up to 50% She is on Entresto and metoprolol. ECHO 03/2024:  1. The left ventricle is normal in size with mildly increased wall  thickness.   2. The left ventricular systolic function is mildly decreased, LVEF is  visually estimated at 45-50%.    3. There is inferoseptal hypokinesis possibly from conduction delay.    4. The right ventricle is normal in size, with normal systolic function.    5. There is moderate aortic regurgitation.        Insomnia disorder (Chronic)   She continues on Xanax  0.5 mg at bedtime since she was widowed years ago. No evidence of misuse.      Relevant Medications   ALPRAZolam  (XANAX ) 0.5 MG tablet   Stage 3a chronic kidney disease (HCC) - Primary (Chronic)   Continue to avoid nephrotoxic agents and maintain hydration. Last GFR 42; one year ago 50 Will recheck today and recommend monitoring biannually.      Relevant Orders   Renal function panel   Long-term current use of testosterone  replacement therapy (Chronic)   Hot flash, menopausal   Severe vasomotor symptoms after menopause.  She is adamant to continue HRT despite current recommendations to discontinue after 5 years. She is also using testosterone  pellets from another provider for additional symptom relief. Recommend monitoring CBC and lipids - being checked annually      Relevant Medications   conjugated estrogens  (PREMARIN ) vaginal cream    Return in about 6 months (around 02/17/2025) for HTN, insomnia  Dr. Lemon.    Leita HILARIO Adie, MD [facility] Medicine [place]

## 2024-08-20 NOTE — Assessment & Plan Note (Addendum)
 Severe vasomotor symptoms after menopause.  She is adamant to continue HRT despite current recommendations to discontinue after 5 years. She is also using testosterone  pellets from another provider for additional symptom relief. Recommend monitoring CBC and lipids - being checked annually

## 2024-08-20 NOTE — Assessment & Plan Note (Signed)
 She continues on Xanax  0.5 mg at bedtime since she was widowed years ago. No evidence of misuse.

## 2024-08-20 NOTE — Assessment & Plan Note (Signed)
 Supplemented. Will refill medications and check thyroid  function.

## 2024-08-20 NOTE — Assessment & Plan Note (Addendum)
 Doing very well from cardiac standpoint. Recent evaluation showed EF up to 50% She is on Entresto and metoprolol. ECHO 03/2024:  1. The left ventricle is normal in size with mildly increased wall  thickness.   2. The left ventricular systolic function is mildly decreased, LVEF is  visually estimated at 45-50%.    3. There is inferoseptal hypokinesis possibly from conduction delay.    4. The right ventricle is normal in size, with normal systolic function.    5. There is moderate aortic regurgitation.

## 2024-08-20 NOTE — Assessment & Plan Note (Signed)
 Continue to avoid nephrotoxic agents and maintain hydration. Last GFR 42; one year ago 50 Will recheck today and recommend monitoring biannually.

## 2024-08-21 ENCOUNTER — Ambulatory Visit: Payer: Self-pay | Admitting: Internal Medicine

## 2024-08-21 LAB — RENAL FUNCTION PANEL
Albumin: 4.2 g/dL (ref 3.8–4.8)
BUN/Creatinine Ratio: 21 (ref 12–28)
BUN: 26 mg/dL (ref 8–27)
CO2: 23 mmol/L (ref 20–29)
Calcium: 9.6 mg/dL (ref 8.7–10.3)
Chloride: 100 mmol/L (ref 96–106)
Creatinine, Ser: 1.26 mg/dL — ABNORMAL HIGH (ref 0.57–1.00)
Glucose: 97 mg/dL (ref 70–99)
Phosphorus: 3.4 mg/dL (ref 3.0–4.3)
Potassium: 4.6 mmol/L (ref 3.5–5.2)
Sodium: 136 mmol/L (ref 134–144)
eGFR: 43 mL/min/1.73 — ABNORMAL LOW (ref >59)

## 2024-08-21 LAB — THYROID PANEL WITH TSH
Free Thyroxine Index: 1.8 (ref 1.2–4.9)
T3 Uptake Ratio: 21 % — ABNORMAL LOW (ref 24–39)
T4, Total: 8.4 ug/dL (ref 4.5–12.0)
TSH: 7.49 u[IU]/mL — ABNORMAL HIGH (ref 0.450–4.500)

## 2024-10-13 ENCOUNTER — Other Ambulatory Visit: Payer: Self-pay | Admitting: Internal Medicine

## 2024-10-15 NOTE — Telephone Encounter (Signed)
 Requested Prescriptions  Pending Prescriptions Disp Refills   RETIN-A  0.1 % cream [Pharmacy Med Name: RETIN-A  0.1% CREAM] 45 g 1    Sig: APPLY TO AFFECTED AREA EVERY DAY AT BEDTIME     Dermatology:  Acne preparations Passed - 10/15/2024  1:31 PM      Passed - Valid encounter within last 12 months    Recent Outpatient Visits           1 month ago Stage 3a chronic kidney disease (HCC)   [place] [facility] at [facility], Leita DEL, MD   8 months ago Post-infectious hypothyroidism   [facility] at [facility], Leita DEL, MD

## 2024-10-20 ENCOUNTER — Other Ambulatory Visit: Payer: Self-pay | Admitting: Internal Medicine

## 2024-10-20 DIAGNOSIS — N951 Menopausal and female climacteric states: Secondary | ICD-10-CM

## 2024-10-22 NOTE — Telephone Encounter (Signed)
 Requested medication (s) are due for refill today - yes  Requested medication (s) are on the active medication list -yes  Future visit scheduled -yes  Last refill: 08/11/24 #84  Notes to clinic: fails imaging protocol- no current MM  Requested Prescriptions  Pending Prescriptions Disp Refills   PREMPHASE  0.625-5 MG TABS tablet [Pharmacy Med Name: PREMPHASE  0.625-5 MG TABLET] 84 tablet 0    Sig: TAKE 1 TABLET BY MOUTH EVERY DAY     OB/GYN:  Hormone Combinations Failed - 10/22/2024 12:41 PM      Failed - Mammogram is up-to-date per Health Maintenance      Passed - Last BP in normal range    BP Readings from Last 1 Encounters:  08/20/24 122/72         Passed - Valid encounter within last 12 months    Recent Outpatient Visits           2 months ago Stage 3a chronic kidney disease (HCC)   [place] [facility] at [facility], Leita DEL, MD   8 months ago Post-infectious hypothyroidism   [place] [facility] at [facility], Leita DEL, MD                 Requested Prescriptions  Pending Prescriptions Disp Refills   PREMPHASE  0.625-5 MG TABS tablet [Pharmacy Med Name: PREMPHASE  0.625-5 MG TABLET] 84 tablet 0    Sig: TAKE 1 TABLET BY MOUTH EVERY DAY     OB/GYN:  Hormone Combinations Failed - 10/22/2024 12:41 PM      Failed - Mammogram is up-to-date per Health Maintenance      Passed - Last BP in normal range    BP Readings from Last 1 Encounters:  08/20/24 122/72         Passed - Valid encounter within last 12 months    Recent Outpatient Visits           2 months ago Stage 3a chronic kidney disease (HCC)   [place] [facility] at [facility], Leita DEL, MD   8 months ago Post-infectious hypothyroidism   [facility] at [facility], Leita DEL, MD

## 2024-12-20 ENCOUNTER — Other Ambulatory Visit: Payer: Self-pay | Admitting: Internal Medicine

## 2024-12-22 NOTE — Telephone Encounter (Signed)
 Requested Prescriptions  Pending Prescriptions Disp Refills   RETIN-A  0.1 % cream [Pharmacy Med Name: RETIN-A  0.1% CREAM] 45 g 0    Sig: APPLY TO AFFECTED AREA EVERY DAY AT BEDTIME     Dermatology:  Acne preparations Passed - 12/22/2024 11:00 AM      Passed - Valid encounter within last 12 months    Recent Outpatient Visits           4 months ago Stage 3a chronic kidney disease (HCC)   [place] [facility] at [facility], Leita DEL, MD   11 months ago Post-infectious hypothyroidism   [facility] at [facility], Leita DEL, MD

## 2025-01-19 ENCOUNTER — Other Ambulatory Visit: Payer: Self-pay | Admitting: Student

## 2025-01-20 NOTE — Telephone Encounter (Signed)
 Requested Prescriptions  Pending Prescriptions Disp Refills   RETIN-A  0.1 % cream [Pharmacy Med Name: RETIN-A  0.1% CREAM] 45 g 0    Sig: APPLY TO AFFECTED AREA EVERY DAY AT BEDTIME     Dermatology:  Acne preparations Passed - 01/20/2025  4:00 PM      Passed - Valid encounter within last 12 months    Recent Outpatient Visits           5 months ago Stage 3a chronic kidney disease (HCC)   [place] [facility] at [facility], Leita DEL, MD   11 months ago Post-infectious hypothyroidism   [facility] at [facility], Leita DEL, MD

## 2025-02-18 ENCOUNTER — Encounter: Admitting: Student
